# Patient Record
Sex: Male | Born: 1950 | Race: White | Hispanic: No | Marital: Married | Smoking: Never smoker
Health system: Southern US, Community
[De-identification: ages and names within clinical notes are randomized; demographics above are authoritative.]

## PROBLEM LIST (undated history)

## (undated) DIAGNOSIS — K5790 Diverticulosis of intestine, part unspecified, without perforation or abscess without bleeding: Secondary | ICD-10-CM

## (undated) DIAGNOSIS — M1990 Unspecified osteoarthritis, unspecified site: Secondary | ICD-10-CM

---

## 2018-02-28 ENCOUNTER — Emergency Department (HOSPITAL_COMMUNITY)
Admission: EM | Admit: 2018-02-28 | Discharge: 2018-02-28 | Disposition: A | Discharge status: Home / Self Care | Payer: Medicare Other | Attending: Emergency Medicine | Admitting: Emergency Medicine

## 2018-02-28 ENCOUNTER — Emergency Department (HOSPITAL_COMMUNITY): Payer: Medicare Other

## 2018-02-28 ENCOUNTER — Encounter (HOSPITAL_COMMUNITY): Payer: Self-pay | Admitting: Emergency Medicine

## 2018-02-28 DIAGNOSIS — M25552 Pain in left hip: Secondary | ICD-10-CM | POA: Diagnosis not present

## 2018-02-28 DIAGNOSIS — M5416 Radiculopathy, lumbar region: Secondary | ICD-10-CM | POA: Insufficient documentation

## 2018-02-28 LAB — URINALYSIS, ROUTINE W REFLEX MICROSCOPIC
BILIRUBIN URINE: NEGATIVE
Glucose, UA: NEGATIVE mg/dL
Hgb urine dipstick: NEGATIVE
KETONES UR: 20 mg/dL — AB
LEUKOCYTES UA: NEGATIVE
Nitrite: NEGATIVE
PROTEIN: NEGATIVE mg/dL
Specific Gravity, Urine: 1.011 (ref 1.005–1.030)
pH: 8 (ref 5.0–8.0)

## 2018-02-28 LAB — CBC WITH DIFFERENTIAL/PLATELET
BASOS PCT: 0 %
Basophils Absolute: 0 10*3/uL (ref 0.0–0.1)
EOS ABS: 0.1 10*3/uL (ref 0.0–0.7)
EOS PCT: 1 %
HEMATOCRIT: 42.8 % (ref 39.0–52.0)
Hemoglobin: 14.1 g/dL (ref 13.0–17.0)
Lymphocytes Relative: 18 %
Lymphs Abs: 1.5 10*3/uL (ref 0.7–4.0)
MCH: 27.1 pg (ref 26.0–34.0)
MCHC: 32.9 g/dL (ref 30.0–36.0)
MCV: 82.1 fL (ref 78.0–100.0)
MONO ABS: 0.8 10*3/uL (ref 0.1–1.0)
Monocytes Relative: 9 %
Neutro Abs: 6.1 10*3/uL (ref 1.7–7.7)
Neutrophils Relative %: 72 %
PLATELETS: 281 10*3/uL (ref 150–400)
RBC: 5.21 MIL/uL (ref 4.22–5.81)
RDW: 14.5 % (ref 11.5–15.5)
WBC: 8.5 10*3/uL (ref 4.0–10.5)

## 2018-02-28 LAB — COMPREHENSIVE METABOLIC PANEL
ALBUMIN: 4 g/dL (ref 3.5–5.0)
ALT: 16 U/L (ref 0–44)
ANION GAP: 8 (ref 5–15)
AST: 20 U/L (ref 15–41)
Alkaline Phosphatase: 55 U/L (ref 38–126)
BILIRUBIN TOTAL: 1.1 mg/dL (ref 0.3–1.2)
BUN: 11 mg/dL (ref 8–23)
CHLORIDE: 107 mmol/L (ref 98–111)
CO2: 27 mmol/L (ref 22–32)
Calcium: 9.3 mg/dL (ref 8.9–10.3)
Creatinine, Ser: 1.01 mg/dL (ref 0.61–1.24)
GFR calc Af Amer: >60 mL/min (ref >60)
GFR calc non Af Amer: >60 mL/min (ref >60)
GLUCOSE: 102 mg/dL — AB (ref 70–99)
POTASSIUM: 4 mmol/L (ref 3.5–5.1)
SODIUM: 142 mmol/L (ref 135–145)
TOTAL PROTEIN: 6.7 g/dL (ref 6.5–8.1)

## 2018-02-28 MED ORDER — HYDROCODONE-ACETAMINOPHEN 5-325 MG PO TABS
2.0000 | ORAL_TABLET | Freq: Once | ORAL | Status: AC
  Administered 2018-02-28: 2 via ORAL
  Filled 2018-02-28: qty 2

## 2018-02-28 MED ORDER — HYDROCODONE-ACETAMINOPHEN 5-325 MG PO TABS: 1.0000 | ORAL_TABLET | Freq: Four times a day (QID) | ORAL | 0 refills | Status: DC | PRN

## 2018-02-28 MED ORDER — NAPROXEN 500 MG PO TABS: 500.0000 mg | ORAL_TABLET | Freq: Two times a day (BID) | ORAL | 0 refills | Status: DC

## 2018-02-28 NOTE — ED Provider Notes (Signed)
[facility]-EMERGENCY DEPT Provider Note   CSN: [id number] Arrival date & time: 02/28/18  1558     History   Chief Complaint Chief Complaint  Patient presents with  . Hip Pain    HPI Glenn Duncan is a 67 y.o. male.  HPI  67 year old male, he denies any significant prior medical history, he does not smoke any cigarettes, he presents to the hospital today, with a complaint of left-sided hip pain, this seems to radiate down into his thigh and sometimes down into his calf.  He has never had this until his recent move where he has been doing lots of heavy lifting over the last couple of months, it is gradually worsening, worse with standing, totally goes away when he lays down.  It is a significant and severe discomfort which did not get any better with muscle relaxants or anti-inflammatories or a "shot in my butt" which she got at the [facility] by a physician that evaluated him this week.  He went back recently and she recommended that he come here to the hospital for evaluation with a CT scan to make sure he did not have any other acute findings.  The patient denies any numbness or weakness of the leg, the pain seems to radiate down at times.  He denies any history of cancer weight loss fevers or IV drug use.  At this time the pain is 0 out of 10 when he is sitting down.  History reviewed. No pertinent past medical history.  There are no active problems to display for this patient.   History reviewed. No pertinent surgical history.      Home Medications    Prior to Admission medications   Medication Sig Start Date End Date Taking? Authorizing Provider  diclofenac (VOLTAREN) 50 MG EC tablet Take 50 mg by mouth every 8 (eight) hours as needed for mild pain or moderate pain.   Yes [provider]  Menthol (BIOFREEZE) 10 % AERO Apply 1 applicator topically daily as needed (pain).   Yes [provider]  naproxen sodium (ALEVE)  220 MG tablet Take 440 mg by mouth 2 (two) times daily as needed (pain).   Yes [provider]  HYDROcodone-acetaminophen (NORCO/VICODIN) 5-325 MG tablet Take 1 tablet by mouth every 6 (six) hours as needed for severe pain. 02/28/18   Eber Hong, MD  naproxen (NAPROSYN) 500 MG tablet Take 1 tablet (500 mg total) by mouth 2 (two) times daily with a meal. 02/28/18   Eber Hong, MD    Family History No family history on file.  Social History Social History   Tobacco Use  . Smoking status: Never Smoker  . Smokeless tobacco: Never Used  Substance Use Topics  . Alcohol use: Not Currently  . Drug use: Not on file     Allergies   Patient has no known allergies.   Review of Systems Review of Systems  All other systems reviewed and are negative.    Physical Exam Updated Vital Signs BP (!) 164/91 (BP Location: Left Arm)   Pulse 73   Temp 98.4 F (36.9 C) (Oral)   Resp 18   Ht 5\' 8"  (1.727 m)   Wt 78.1 kg (172 lb 2 oz)   SpO2 99%   BMI 26.17 kg/m   Physical Exam  Constitutional: He appears well-developed and well-nourished. No distress.  HENT:  Head: Normocephalic and atraumatic.  Mouth/Throat: Oropharynx is clear and moist. No oropharyngeal exudate.  Eyes: Pupils  are equal, round, and reactive to light. Conjunctivae and EOM are normal. Right eye exhibits no discharge. Left eye exhibits no discharge. No scleral icterus.  Neck: Normal range of motion. Neck supple. No JVD present. No thyromegaly present.  Cardiovascular: Normal rate, regular rhythm, normal heart sounds and intact distal pulses. Exam reveals no gallop and no friction rub.  No murmur heard. Pulmonary/Chest: Effort normal and breath sounds normal. No respiratory distress. He has no wheezes. He has no rales.  Abdominal: Soft. Bowel sounds are normal. He exhibits no distension and no mass. There is no tenderness.  Musculoskeletal: Normal range of motion. He exhibits no edema or tenderness.  There is  no tenderness to palpation over the thoracic or lumbar spine  Lymphadenopathy:    He has no cervical adenopathy.  Neurological: He is alert. Coordination normal.  The patient is able to straight leg raise bilaterally.  He is able to feel normal sensation diffusely through the bilateral lower extremities, he has normal strength at the hips to flexion and extension, knees to flexion and extension, ankles to dorsiflexion and plantarflexion and is able to raise both great toes against resistance.  He has normal reflexes at the bilateral patellar tendons.  Skin: Skin is warm and dry. No rash noted. No erythema.  Psychiatric: He has a normal mood and affect. His behavior is normal.  Nursing note and vitals reviewed.    ED Treatments / Results  Labs (all labs ordered are listed, but only abnormal results are displayed) Labs Reviewed  COMPREHENSIVE METABOLIC PANEL - Abnormal; Notable for the following components:      Result Value   Glucose, Bld 102 (*)    All other components within normal limits  URINALYSIS, ROUTINE W REFLEX MICROSCOPIC - Abnormal; Notable for the following components:   Color, Urine STRAW (*)    Ketones, ur 20 (*)    All other components within normal limits  CBC WITH DIFFERENTIAL/PLATELET   EKG None  Radiology Ct Lumbar Spine Wo Contrast  Result Date: 02/28/2018 CLINICAL DATA:  Back pain. EXAM: CT LUMBAR SPINE WITHOUT CONTRAST TECHNIQUE: Multidetector CT imaging of the lumbar spine was performed without intravenous contrast administration. Multiplanar CT image reconstructions were also generated. COMPARISON:  None. FINDINGS: Segmentation: 5 lumbar type vertebrae. Alignment: Mild lateral subluxation of L4 relative to the above and below levels. Vertebrae: No acute fracture or focal pathologic process. There is degenerative endplate remodeling at L4-L5. Paraspinal and other soft tissues: Mild calcific aortic atherosclerosis. Otherwise normal. Disc levels: T12-L1: Normal.  L1-L2: Normal. L2-L3: Normal. L3-L4: Disc space narrowing with disc vacuum phenomenon. Mild facet hypertrophy. Mild narrowing of the spinal canal. Mild right foraminal stenosis. L4-L5: Severe disc space narrowing with moderate facet hypertrophy. No spinal canal stenosis. Moderate right and severe left foraminal stenosis. L5-S1: Severe right and moderate left facet hypertrophy. No spinal canal stenosis. Moderate right and severe left foraminal stenosis. IMPRESSION: 1. L5-S1 moderate right, severe left neural foraminal stenosis, primarily due to moderate-to-severe facet arthrosis. 2. L4-L5 moderate right, severe left foraminal stenosis due to combination of disc degeneration and moderate facet arthrosis. 3.  Aortic Atherosclerosis (ICD10-I70.0). Electronically Signed   By: Deatra RobinsonKevin  Herman M.D.   On: 02/28/2018 20:05    Procedures Procedures (including critical care time)  Medications Ordered in ED Medications  HYDROcodone-acetaminophen (NORCO/VICODIN) 5-325 MG per tablet 2 tablet (2 tablets Oral Given 02/28/18 2115)     Initial Impression / Assessment and Plan / ED Course  I have reviewed the triage vital  signs and the nursing notes.  Pertinent labs & imaging results that were available during my care of the patient were reviewed by me and considered in my medical decision making (see chart for details).     The patient has had progressive pain going into his left thigh and leg, this does not appear to be consistent with bursitis and he has no tenderness over the bursa, has no tenderness with range of motion of the hip, no history of other medical history including cancer, will perform a CT scan of the lumbar spine as there is no MRI available at this time.  Will evaluate for bony cause of his symptoms, he may need outpatient MRI.  This does not seem to be abdominal as he has no abdominal tenderness.  CT shows some foraminal stenosis - no acute neurological findings Pt agreeable to f/u - needs pain  medicine / nsaids / pred didn't work before, no indication to redose at [employer] time. Pt given copy of CT report.  Final Clinical Impressions(s) / ED Diagnoses   Final diagnoses:  Lumbar radiculopathy    ED Discharge Orders        Ordered    HYDROcodone-acetaminophen (NORCO/VICODIN) 5-325 MG tablet  Every 6 hours PRN     02/28/18 2128    naproxen (NAPROSYN) 500 MG tablet  2 times daily with meals     02/28/18 2128       Eber Hong, MD 02/28/18 2131

## 2018-02-28 NOTE — Discharge Instructions (Addendum)
Please take Naprosyn twice daily Hydrocodone only for severe pain  Call Dr. Jordan LikesPOol for next available visit.  ER for severe or worsening pain / numbness or weakness

## 2018-02-28 NOTE — ED Triage Notes (Signed)
Pt reports hx of bursitis in left hip and taken medications and no relief. Reports he drove to [place] back in April and was seen at facility then. Pt reports pain is worse with standing and sitting but will subside with laying flat.

## 2018-03-23 ENCOUNTER — Emergency Department (HOSPITAL_COMMUNITY): Payer: Medicare Other

## 2018-03-23 ENCOUNTER — Inpatient Hospital Stay (HOSPITAL_COMMUNITY): Payer: Medicare Other | Admitting: Anesthesiology

## 2018-03-23 ENCOUNTER — Encounter (HOSPITAL_COMMUNITY): Admission: EM | Disposition: A | Discharge status: Home Health | Payer: Self-pay | Source: Home / Self Care

## 2018-03-23 ENCOUNTER — Inpatient Hospital Stay (HOSPITAL_COMMUNITY): Payer: Medicare Other

## 2018-03-23 ENCOUNTER — Inpatient Hospital Stay (HOSPITAL_COMMUNITY)
Admission: EM | Admit: 2018-03-23 | Discharge: 2018-03-27 | DRG: 853 | Disposition: A | Discharge status: Home Health | Payer: Medicare Other | Attending: Surgery | Admitting: Surgery

## 2018-03-23 ENCOUNTER — Other Ambulatory Visit: Payer: Self-pay

## 2018-03-23 ENCOUNTER — Encounter (HOSPITAL_COMMUNITY): Payer: Self-pay | Admitting: Emergency Medicine

## 2018-03-23 DIAGNOSIS — Z79899 Other long term (current) drug therapy: Secondary | ICD-10-CM

## 2018-03-23 DIAGNOSIS — K5720 Diverticulitis of large intestine with perforation and abscess without bleeding: Secondary | ICD-10-CM | POA: Diagnosis present

## 2018-03-23 DIAGNOSIS — K659 Peritonitis, unspecified: Secondary | ICD-10-CM | POA: Diagnosis present

## 2018-03-23 DIAGNOSIS — R198 Other specified symptoms and signs involving the digestive system and abdomen: Secondary | ICD-10-CM | POA: Diagnosis present

## 2018-03-23 DIAGNOSIS — G8929 Other chronic pain: Secondary | ICD-10-CM | POA: Diagnosis present

## 2018-03-23 DIAGNOSIS — A419 Sepsis, unspecified organism: Principal | ICD-10-CM | POA: Diagnosis present

## 2018-03-23 DIAGNOSIS — M549 Dorsalgia, unspecified: Secondary | ICD-10-CM | POA: Diagnosis present

## 2018-03-23 DIAGNOSIS — Z978 Presence of other specified devices: Secondary | ICD-10-CM

## 2018-03-23 DIAGNOSIS — K631 Perforation of intestine (nontraumatic): Secondary | ICD-10-CM

## 2018-03-23 DIAGNOSIS — K56609 Unspecified intestinal obstruction, unspecified as to partial versus complete obstruction: Secondary | ICD-10-CM

## 2018-03-23 HISTORY — PX: LAPAROTOMY: SHX154

## 2018-03-23 LAB — URINALYSIS, ROUTINE W REFLEX MICROSCOPIC
BILIRUBIN URINE: NEGATIVE
Glucose, UA: NEGATIVE mg/dL
HGB URINE DIPSTICK: NEGATIVE
KETONES UR: 5 mg/dL — AB
LEUKOCYTES UA: NEGATIVE
Nitrite: NEGATIVE
Protein, ur: 100 mg/dL — AB
pH: 5 (ref 5.0–8.0)

## 2018-03-23 LAB — I-STAT CHEM 8, ED
BUN: 28 mg/dL — ABNORMAL HIGH (ref 8–23)
CALCIUM ION: 1.11 mmol/L — AB (ref 1.15–1.40)
Chloride: 98 mmol/L (ref 98–111)
Creatinine, Ser: 1.2 mg/dL (ref 0.61–1.24)
GLUCOSE: 160 mg/dL — AB (ref 70–99)
HCT: 48 % (ref 39.0–52.0)
HEMOGLOBIN: 16.3 g/dL (ref 13.0–17.0)
POTASSIUM: 3.9 mmol/L (ref 3.5–5.1)
Sodium: 134 mmol/L — ABNORMAL LOW (ref 135–145)
TCO2: 25 mmol/L (ref 22–32)

## 2018-03-23 LAB — D-DIMER, QUANTITATIVE (NOT AT ARMC): D DIMER QUANT: 3.99 ug{FEU}/mL — AB (ref 0.00–0.50)

## 2018-03-23 LAB — CBC
HEMATOCRIT: 48 % (ref 39.0–52.0)
HEMOGLOBIN: 16.2 g/dL (ref 13.0–17.0)
MCH: 27.5 pg (ref 26.0–34.0)
MCHC: 33.8 g/dL (ref 30.0–36.0)
MCV: 81.5 fL (ref 78.0–100.0)
Platelets: 253 10*3/uL (ref 150–400)
RBC: 5.89 MIL/uL — AB (ref 4.22–5.81)
RDW: 14.5 % (ref 11.5–15.5)
WBC: 17.2 10*3/uL — AB (ref 4.0–10.5)

## 2018-03-23 LAB — COMPREHENSIVE METABOLIC PANEL
ALT: 48 U/L — ABNORMAL HIGH (ref 0–44)
AST: 42 U/L — ABNORMAL HIGH (ref 15–41)
Albumin: 3.9 g/dL (ref 3.5–5.0)
Alkaline Phosphatase: 93 U/L (ref 38–126)
Anion gap: 17 — ABNORMAL HIGH (ref 5–15)
BUN: 26 mg/dL — ABNORMAL HIGH (ref 8–23)
CHLORIDE: 98 mmol/L (ref 98–111)
CO2: 24 mmol/L (ref 22–32)
Calcium: 10 mg/dL (ref 8.9–10.3)
Creatinine, Ser: 1.44 mg/dL — ABNORMAL HIGH (ref 0.61–1.24)
GFR, EST AFRICAN AMERICAN: 57 mL/min — AB (ref >60)
GFR, EST NON AFRICAN AMERICAN: 49 mL/min — AB (ref >60)
Glucose, Bld: 163 mg/dL — ABNORMAL HIGH (ref 70–99)
POTASSIUM: 4.4 mmol/L (ref 3.5–5.1)
Sodium: 139 mmol/L (ref 135–145)
Total Bilirubin: 1.2 mg/dL (ref 0.3–1.2)
Total Protein: 8 g/dL (ref 6.5–8.1)

## 2018-03-23 LAB — SURGICAL PCR SCREEN
MRSA, PCR: NEGATIVE
Staphylococcus aureus: NEGATIVE

## 2018-03-23 LAB — MAGNESIUM: MAGNESIUM: 2.2 mg/dL (ref 1.7–2.4)

## 2018-03-23 LAB — LIPASE, BLOOD: LIPASE: 22 U/L (ref 11–51)

## 2018-03-23 LAB — I-STAT CG4 LACTIC ACID, ED: Lactic Acid, Venous: 1.88 mmol/L (ref 0.5–1.9)

## 2018-03-23 SURGERY — LAPAROTOMY, EXPLORATORY
Anesthesia: General

## 2018-03-23 MED ORDER — FENTANYL CITRATE (PF) 100 MCG/2ML IJ SOLN: 25.0000 ug | INTRAMUSCULAR | Status: DC | PRN

## 2018-03-23 MED ORDER — MIDAZOLAM HCL 2 MG/2ML IJ SOLN
INTRAMUSCULAR | Status: AC
  Filled 2018-03-23: qty 2

## 2018-03-23 MED ORDER — CHLORHEXIDINE GLUCONATE CLOTH 2 % EX PADS: 6.0000 | MEDICATED_PAD | Freq: Once | CUTANEOUS | Status: DC

## 2018-03-23 MED ORDER — ONDANSETRON HCL 4 MG/2ML IJ SOLN
4.0000 mg | Freq: Once | INTRAMUSCULAR | Status: AC
  Administered 2018-03-23: 4 mg via INTRAVENOUS
  Filled 2018-03-23: qty 2

## 2018-03-23 MED ORDER — 0.9 % SODIUM CHLORIDE (POUR BTL) OPTIME
TOPICAL | Status: DC | PRN
  Administered 2018-03-23: 3000 mL

## 2018-03-23 MED ORDER — VANCOMYCIN HCL IN DEXTROSE 1-5 GM/200ML-% IV SOLN
1000.0000 mg | Freq: Once | INTRAVENOUS | Status: AC
  Administered 2018-03-23: 1000 mg via INTRAVENOUS
  Filled 2018-03-23: qty 200

## 2018-03-23 MED ORDER — ONDANSETRON HCL 4 MG/2ML IJ SOLN: 4.0000 mg | Freq: Four times a day (QID) | INTRAMUSCULAR | Status: DC | PRN

## 2018-03-23 MED ORDER — LIDOCAINE 2% (20 MG/ML) 5 ML SYRINGE
INTRAMUSCULAR | Status: DC | PRN
  Administered 2018-03-23: 50 mg via INTRAVENOUS

## 2018-03-23 MED ORDER — FENTANYL CITRATE (PF) 250 MCG/5ML IJ SOLN
INTRAMUSCULAR | Status: AC
  Filled 2018-03-23: qty 5

## 2018-03-23 MED ORDER — DIPHENHYDRAMINE HCL 12.5 MG/5ML PO ELIX: 12.5000 mg | ORAL_SOLUTION | Freq: Four times a day (QID) | ORAL | Status: DC | PRN

## 2018-03-23 MED ORDER — SODIUM CHLORIDE 0.9 % IV SOLN
INTRAVENOUS | Status: DC
  Administered 2018-03-23 – 2018-03-27 (×6): via INTRAVENOUS

## 2018-03-23 MED ORDER — NALOXONE HCL 0.4 MG/ML IJ SOLN
0.4000 mg | INTRAMUSCULAR | Status: DC | PRN
Start: 2018-03-23 — End: 2018-03-26

## 2018-03-23 MED ORDER — DEXAMETHASONE SODIUM PHOSPHATE 10 MG/ML IJ SOLN
INTRAMUSCULAR | Status: AC
  Filled 2018-03-23: qty 1

## 2018-03-23 MED ORDER — MORPHINE SULFATE (PF) 2 MG/ML IV SOLN: 1.0000 mg | INTRAVENOUS | Status: DC | PRN

## 2018-03-23 MED ORDER — SUGAMMADEX SODIUM 200 MG/2ML IV SOLN
INTRAVENOUS | Status: AC
  Filled 2018-03-23: qty 2

## 2018-03-23 MED ORDER — ONDANSETRON HCL 4 MG/2ML IJ SOLN: 4.0000 mg | Freq: Once | INTRAMUSCULAR | Status: DC | PRN

## 2018-03-23 MED ORDER — ACETAMINOPHEN 10 MG/ML IV SOLN
INTRAVENOUS | Status: AC
  Filled 2018-03-23: qty 100

## 2018-03-23 MED ORDER — SODIUM CHLORIDE 0.9% FLUSH: 9.0000 mL | INTRAVENOUS | Status: DC | PRN

## 2018-03-23 MED ORDER — LACTATED RINGERS IV SOLN
INTRAVENOUS | Status: DC | PRN
  Administered 2018-03-23 (×2): via INTRAVENOUS

## 2018-03-23 MED ORDER — FAMOTIDINE IN NACL 20-0.9 MG/50ML-% IV SOLN
20.0000 mg | Freq: Two times a day (BID) | INTRAVENOUS | Status: DC
  Administered 2018-03-23 – 2018-03-26 (×8): 20 mg via INTRAVENOUS
  Filled 2018-03-23 (×9): qty 50

## 2018-03-23 MED ORDER — ONDANSETRON HCL 4 MG/2ML IJ SOLN
INTRAMUSCULAR | Status: AC
  Filled 2018-03-23: qty 2

## 2018-03-23 MED ORDER — HYDROMORPHONE HCL 2 MG/ML IJ SOLN
INTRAMUSCULAR | Status: AC
  Filled 2018-03-23: qty 1

## 2018-03-23 MED ORDER — METHOCARBAMOL 500 MG IVPB - SIMPLE MED
500.0000 mg | Freq: Three times a day (TID) | INTRAVENOUS | Status: DC
  Administered 2018-03-23 – 2018-03-26 (×8): 500 mg via INTRAVENOUS
  Filled 2018-03-23 (×3): qty 500
  Filled 2018-03-23 (×2): qty 50
  Filled 2018-03-23 (×3): qty 500
  Filled 2018-03-23: qty 50
  Filled 2018-03-23: qty 500

## 2018-03-23 MED ORDER — SUCCINYLCHOLINE CHLORIDE 200 MG/10ML IV SOSY
PREFILLED_SYRINGE | INTRAVENOUS | Status: AC
  Filled 2018-03-23: qty 10

## 2018-03-23 MED ORDER — ROCURONIUM BROMIDE 10 MG/ML (PF) SYRINGE
PREFILLED_SYRINGE | INTRAVENOUS | Status: AC
  Filled 2018-03-23: qty 10

## 2018-03-23 MED ORDER — OXYCODONE HCL 5 MG/5ML PO SOLN
5.0000 mg | Freq: Once | ORAL | Status: DC | PRN
  Filled 2018-03-23: qty 5

## 2018-03-23 MED ORDER — FENTANYL CITRATE (PF) 100 MCG/2ML IJ SOLN
50.0000 ug | Freq: Once | INTRAMUSCULAR | Status: AC
  Administered 2018-03-23: 50 ug via INTRAVENOUS
  Filled 2018-03-23: qty 2

## 2018-03-23 MED ORDER — FENTANYL CITRATE (PF) 100 MCG/2ML IJ SOLN
INTRAMUSCULAR | Status: DC | PRN
  Administered 2018-03-23: 100 ug via INTRAVENOUS
  Administered 2018-03-23 (×3): 50 ug via INTRAVENOUS

## 2018-03-23 MED ORDER — PHENYLEPHRINE 40 MCG/ML (10ML) SYRINGE FOR IV PUSH (FOR BLOOD PRESSURE SUPPORT)
PREFILLED_SYRINGE | INTRAVENOUS | Status: DC | PRN
  Administered 2018-03-23: 80 ug via INTRAVENOUS
  Administered 2018-03-23: 40 ug via INTRAVENOUS

## 2018-03-23 MED ORDER — OXYCODONE HCL 5 MG PO TABS: 5.0000 mg | ORAL_TABLET | Freq: Once | ORAL | Status: DC | PRN

## 2018-03-23 MED ORDER — MORPHINE SULFATE 2 MG/ML IV SOLN
INTRAVENOUS | Status: DC
  Administered 2018-03-23: 1.5 mg via INTRAVENOUS
  Administered 2018-03-23: 17:00:00 via INTRAVENOUS
  Administered 2018-03-23: 6 mg via INTRAVENOUS
  Administered 2018-03-24: 1.5 mg via INTRAVENOUS
  Administered 2018-03-24: 0 mg via INTRAVENOUS
  Administered 2018-03-24: 2 mg via INTRAVENOUS
  Administered 2018-03-24 – 2018-03-25 (×4): 0 mg via INTRAVENOUS
  Administered 2018-03-25: 2 mg via INTRAVENOUS
  Administered 2018-03-25 – 2018-03-26 (×3): 0 mg via INTRAVENOUS
  Filled 2018-03-23: qty 25

## 2018-03-23 MED ORDER — LACTATED RINGERS IV SOLN
INTRAVENOUS | Status: DC
  Administered 2018-03-23 (×2): via INTRAVENOUS

## 2018-03-23 MED ORDER — ROCURONIUM BROMIDE 10 MG/ML (PF) SYRINGE
PREFILLED_SYRINGE | INTRAVENOUS | Status: DC | PRN
  Administered 2018-03-23: 50 mg via INTRAVENOUS

## 2018-03-23 MED ORDER — LIDOCAINE 2% (20 MG/ML) 5 ML SYRINGE
INTRAMUSCULAR | Status: AC
  Filled 2018-03-23: qty 5

## 2018-03-23 MED ORDER — SODIUM CHLORIDE 0.9 % IV SOLN
2.0000 g | INTRAVENOUS | Status: AC
  Administered 2018-03-23: 2 g via INTRAVENOUS
  Filled 2018-03-23: qty 2

## 2018-03-23 MED ORDER — IOPAMIDOL (ISOVUE-370) INJECTION 76%
INTRAVENOUS | Status: AC
  Filled 2018-03-23: qty 100

## 2018-03-23 MED ORDER — ALBUMIN HUMAN 5 % IV SOLN
INTRAVENOUS | Status: DC | PRN
  Administered 2018-03-23: 15:00:00 via INTRAVENOUS

## 2018-03-23 MED ORDER — DIPHENHYDRAMINE HCL 50 MG/ML IJ SOLN: 12.5000 mg | Freq: Four times a day (QID) | INTRAMUSCULAR | Status: DC | PRN

## 2018-03-23 MED ORDER — IOPAMIDOL (ISOVUE-370) INJECTION 76%
100.0000 mL | Freq: Once | INTRAVENOUS | Status: AC | PRN
  Administered 2018-03-23: 100 mL via INTRAVENOUS

## 2018-03-23 MED ORDER — CHLORHEXIDINE GLUCONATE CLOTH 2 % EX PADS
6.0000 | MEDICATED_PAD | Freq: Once | CUTANEOUS | Status: AC
  Administered 2018-03-23: 6 via TOPICAL

## 2018-03-23 MED ORDER — ONDANSETRON HCL 4 MG/2ML IJ SOLN
INTRAMUSCULAR | Status: DC | PRN
  Administered 2018-03-23: 4 mg via INTRAVENOUS

## 2018-03-23 MED ORDER — ONDANSETRON 4 MG PO TBDP: 4.0000 mg | ORAL_TABLET | Freq: Four times a day (QID) | ORAL | Status: DC | PRN

## 2018-03-23 MED ORDER — PROPOFOL 10 MG/ML IV BOLUS
INTRAVENOUS | Status: AC
  Filled 2018-03-23: qty 20

## 2018-03-23 MED ORDER — PHENYLEPHRINE 40 MCG/ML (10ML) SYRINGE FOR IV PUSH (FOR BLOOD PRESSURE SUPPORT)
PREFILLED_SYRINGE | INTRAVENOUS | Status: AC
  Filled 2018-03-23: qty 10

## 2018-03-23 MED ORDER — DEXAMETHASONE SODIUM PHOSPHATE 10 MG/ML IJ SOLN
INTRAMUSCULAR | Status: DC | PRN
  Administered 2018-03-23: 10 mg via INTRAVENOUS

## 2018-03-23 MED ORDER — ACETAMINOPHEN 10 MG/ML IV SOLN
1000.0000 mg | Freq: Four times a day (QID) | INTRAVENOUS | Status: AC
  Administered 2018-03-23 – 2018-03-24 (×4): 1000 mg via INTRAVENOUS
  Filled 2018-03-23 (×4): qty 100

## 2018-03-23 MED ORDER — METHOCARBAMOL 500 MG IVPB - SIMPLE MED
INTRAVENOUS | Status: AC
  Administered 2018-03-23: 500 mg via INTRAVENOUS
  Filled 2018-03-23: qty 50

## 2018-03-23 MED ORDER — PROPOFOL 10 MG/ML IV BOLUS
INTRAVENOUS | Status: DC | PRN
  Administered 2018-03-23: 200 mg via INTRAVENOUS

## 2018-03-23 MED ORDER — SUGAMMADEX SODIUM 200 MG/2ML IV SOLN
INTRAVENOUS | Status: DC | PRN
  Administered 2018-03-23: 150 mg via INTRAVENOUS

## 2018-03-23 MED ORDER — LACTATED RINGERS IV SOLN: INTRAVENOUS | Status: DC

## 2018-03-23 MED ORDER — SUCCINYLCHOLINE CHLORIDE 200 MG/10ML IV SOSY
PREFILLED_SYRINGE | INTRAVENOUS | Status: DC | PRN
  Administered 2018-03-23: 120 mg via INTRAVENOUS

## 2018-03-23 MED ORDER — HYDROMORPHONE HCL 1 MG/ML IJ SOLN
INTRAMUSCULAR | Status: DC | PRN
  Administered 2018-03-23 (×4): 0.5 mg via INTRAVENOUS

## 2018-03-23 MED ORDER — MIDAZOLAM HCL 5 MG/5ML IJ SOLN
INTRAMUSCULAR | Status: DC | PRN
  Administered 2018-03-23: 2 mg via INTRAVENOUS

## 2018-03-23 MED ORDER — PIPERACILLIN-TAZOBACTAM 3.375 G IVPB 30 MIN
3.3750 g | Freq: Once | INTRAVENOUS | Status: AC
  Administered 2018-03-23: 3.375 g via INTRAVENOUS
  Filled 2018-03-23: qty 50

## 2018-03-23 MED ORDER — PIPERACILLIN-TAZOBACTAM 3.375 G IVPB
3.3750 g | Freq: Three times a day (TID) | INTRAVENOUS | Status: DC
  Administered 2018-03-23 – 2018-03-27 (×11): 3.375 g via INTRAVENOUS
  Filled 2018-03-23 (×22): qty 50

## 2018-03-23 MED ORDER — SODIUM CHLORIDE 0.9 % IV BOLUS
1000.0000 mL | Freq: Once | INTRAVENOUS | Status: AC
  Administered 2018-03-23: 1000 mL via INTRAVENOUS

## 2018-03-23 MED ORDER — LORAZEPAM 2 MG/ML IJ SOLN: 0.5000 mg | Freq: Once | INTRAMUSCULAR | Status: DC | PRN

## 2018-03-23 SURGICAL SUPPLY — 47 items
APPLICATOR COTTON TIP 6 STRL (MISCELLANEOUS) ×1 IMPLANT
APPLICATOR COTTON TIP 6IN STRL (MISCELLANEOUS) ×3
BARRIER SKIN 2 3/4 (OSTOMY) ×2 IMPLANT
BARRIER SKIN 2 3/4 INCH (OSTOMY) ×1
BLADE EXTENDED COATED 6.5IN (ELECTRODE) IMPLANT
BLADE HEX COATED 2.75 (ELECTRODE) ×3 IMPLANT
BNDG GAUZE ELAST 4 BULKY (GAUZE/BANDAGES/DRESSINGS) ×3 IMPLANT
CHLORAPREP W/TINT 26ML (MISCELLANEOUS) ×3 IMPLANT
COVER MAYO STAND STRL (DRAPES) IMPLANT
COVER SURGICAL LIGHT HANDLE (MISCELLANEOUS) ×3 IMPLANT
DRAPE LAPAROSCOPIC ABDOMINAL (DRAPES) ×3 IMPLANT
DRAPE WARM FLUID 44X44 (DRAPE) ×3 IMPLANT
DRSG PAD ABDOMINAL 8X10 ST (GAUZE/BANDAGES/DRESSINGS) ×6 IMPLANT
ELECT BLADE TIP CTD 4 INCH (ELECTRODE) ×3 IMPLANT
ELECT REM PT RETURN 15FT ADLT (MISCELLANEOUS) ×3 IMPLANT
GAUZE SPONGE 4X4 12PLY STRL (GAUZE/BANDAGES/DRESSINGS) ×3 IMPLANT
GLOVE BIOGEL PI IND STRL 7.0 (GLOVE) ×1 IMPLANT
GLOVE BIOGEL PI INDICATOR 7.0 (GLOVE) ×2
GLOVE SURG ORTHO 8.0 STRL STRW (GLOVE) ×3 IMPLANT
GOWN STRL REUS W/TWL LRG LVL3 (GOWN DISPOSABLE) ×3 IMPLANT
GOWN STRL REUS W/TWL XL LVL3 (GOWN DISPOSABLE) ×6 IMPLANT
HANDLE SUCTION POOLE (INSTRUMENTS) ×1 IMPLANT
KIT BASIN OR (CUSTOM PROCEDURE TRAY) ×3 IMPLANT
PACK GENERAL/GYN (CUSTOM PROCEDURE TRAY) ×3 IMPLANT
POUCH OSTOMY 2 3/4  H 3804 (WOUND CARE) ×2
POUCH OSTOMY 2 PC DRNBL 2.75 (WOUND CARE) ×1 IMPLANT
SEALER TISSUE X1 CVD JAW (INSTRUMENTS) ×3 IMPLANT
SPONGE LAP 18X18 RF (DISPOSABLE) ×6 IMPLANT
STAPLER CUT CVD 40MM BLUE (STAPLE) ×3 IMPLANT
STAPLER PROXIMATE 75MM BLUE (STAPLE) ×3 IMPLANT
STAPLER VISISTAT 35W (STAPLE) IMPLANT
SUCTION POOLE HANDLE (INSTRUMENTS) ×3
SUT NOV 1 T60/GS (SUTURE) ×3 IMPLANT
SUT NOVA 1 T20/GS 25DT (SUTURE) ×15 IMPLANT
SUT PROLENE 2 0 SH DA (SUTURE) ×3 IMPLANT
SUT SILK 2 0 (SUTURE)
SUT SILK 2 0 SH CR/8 (SUTURE) IMPLANT
SUT SILK 2-0 18XBRD TIE 12 (SUTURE) IMPLANT
SUT SILK 3 0 (SUTURE)
SUT SILK 3 0 SH CR/8 (SUTURE) IMPLANT
SUT SILK 3-0 18XBRD TIE 12 (SUTURE) IMPLANT
SUT VIC AB 3-0 SH 18 (SUTURE) ×6 IMPLANT
SUT VICRYL 2 0 18  UND BR (SUTURE)
SUT VICRYL 2 0 18 UND BR (SUTURE) IMPLANT
TOWEL OR 17X26 10 PK STRL BLUE (TOWEL DISPOSABLE) ×3 IMPLANT
TRAY FOLEY MTR SLVR 16FR STAT (SET/KITS/TRAYS/PACK) ×3 IMPLANT
YANKAUER SUCT BULB TIP NO VENT (SUCTIONS) IMPLANT

## 2018-03-23 NOTE — H&P (Signed)
Glenn Duncan is an 67 y.o. male.   Chief Complaint: Abdominal pain, fever, back pain   HPI: Patient seen 02/28/2018 with back pain.  He has been taking nonsteroidals and hydrocodone for pain.  CT at that time showed L5-S1 moderate right severe neuroforaminal stenosis, primarily due to moderate to severe facet arthrosis.  L4-L5 moderate right severe foraminal stenosis due to a combination of disc degeneration and moderate facet arthrosis.  He has seen neurosurgery and Dr. Trenton Gammon for this.  He is taking Aleve 220 q12h and Vicodin about 1 q6h  since that visit .  Today he presented with the above complaint for the last 48 hours, unable to eat, nausea and vomiting, lower abdominal pain.   He was evaluated with 3 view film which addendum shows pneumoperitoneum.  MRI was also obtained which shows dilated small bowel with air-fluid levels and pneumoperitoneum.  Again issues with L2-L3, L3-L4, L4-L5. CT of the abdomen and chest are in progress we are asked to see.  On admission his blood pressure and temperature are normal.  His last blood pressure at 801 was 187/100.  Labs show a creatinine of 1.44 on admission follow-up was 1.20.  Glucose elevated 160.  WBC 17.2 hemoglobin 16, hematocrit 48.  D-dimer 3.99.  Urinalysis is normal.  Lactate is 1.88. CT's chest, abdomen and pelvis with/without contrast:  No PE.  Extensive free air is identified in the abdomen and pelvis more prominently in the abdomen. This consistent with perforated bowel loop. There is stranding and inflammation surrounding thickened small bowel loops in the lower mid abdomen with marked dilatation of small bowel loops proximal to this area of inflammation. The ileal small bowel loops are normal in caliber but demonstrate abnormal diffuse thickened bowel wall. There is diverticulosis of the colon without definite evidence of diverticulitis. The appendix is normal.  History reviewed. No pertinent past medical history. No prior surgeries No  prior medical issues before this History reviewed. No pertinent surgical history.  No family history on file. Social History:  reports that he has never smoked. He has never used smokeless tobacco. He reports that he drank alcohol. His drug history is not on file. He just moved to [place] with wife in June to be near son and grandchildren Retired [employer] farm manager ETOH:  None Drugs:  None Tobacco:  None  Allergies: No Known Allergies  Prior to Admission medications   Medication Sig Start Date End Date Taking? Authorizing Provider  HYDROcodone-acetaminophen (NORCO/VICODIN) 5-325 MG tablet Take 1 tablet by mouth every 6 (six) hours as needed for severe pain. 02/28/18  Yes Noemi Chapel, MD  Menthol (BIOFREEZE) 10 % AERO Apply 1 applicator topically daily as needed (pain).   Yes [provider]  naproxen sodium (ALEVE) 220 MG tablet Take 220 mg by mouth 2 (two) times daily as needed (pain).    Yes [provider]  naproxen (NAPROSYN) 500 MG tablet Take 1 tablet (500 mg total) by mouth 2 (two) times daily with a meal. Patient not taking: Reported on 03/23/2018 02/28/18   Noemi Chapel, MD     Results for orders placed or performed during the hospital encounter of 03/23/18 (from the past 48 hour(s))  Lipase, blood     Status: None   Collection Time: 03/23/18  6:18 AM  Result Value Ref Range   Lipase 22 11 - 51 U/L    Comment: Performed at [facility], [place] [address], [place], [place] [address]  Comprehensive metabolic panel  Status: Abnormal   Collection Time: 03/23/18  6:18 AM  Result Value Ref Range   Sodium 139 135 - 145 mmol/L   Potassium 4.4 3.5 - 5.1 mmol/L   Chloride 98 98 - 111 mmol/L   CO2 24 22 - 32 mmol/L   Glucose, Bld 163 (H) 70 - 99 mg/dL   BUN 26 (H) 8 - 23 mg/dL   Creatinine, Ser 1.44 (H) 0.61 - 1.24 mg/dL   Calcium 10.0 8.9 - 10.3 mg/dL   Total Protein 8.0 6.5 - 8.1 g/dL   Albumin 3.9 3.5 - 5.0 g/dL   AST 42 (H) 15 - 41  U/L   ALT 48 (H) 0 - 44 U/L   Alkaline Phosphatase 93 38 - 126 U/L   Total Bilirubin 1.2 0.3 - 1.2 mg/dL   GFR calc non Af Amer 49 (L) >60 mL/min   GFR calc Af Amer 57 (L) >60 mL/min    Comment: (NOTE) The eGFR has been calculated using the CKD EPI equation. This calculation has not been validated in all clinical situations. eGFR's persistently <60 mL/min signify possible Chronic Kidney Disease.    Anion gap 17 (H) 5 - 15    Comment: Performed at [facility], [place] [address], [place], [place] [address]  CBC     Status: Abnormal   Collection Time: 03/23/18  6:18 AM  Result Value Ref Range   WBC 17.2 (H) 4.0 - 10.5 K/uL   RBC 5.89 (H) 4.22 - 5.81 MIL/uL   Hemoglobin 16.2 13.0 - 17.0 g/dL   HCT 48.0 39.0 - 52.0 %   MCV 81.5 78.0 - 100.0 fL   MCH 27.5 26.0 - 34.0 pg   MCHC 33.8 30.0 - 36.0 g/dL   RDW 14.5 11.5 - 15.5 %   Platelets 253 150 - 400 K/uL    Comment: Performed at [facility], [place] [address] [place], [place] [address]  D-dimer, quantitative (not at [facility])     Status: Abnormal   Collection Time: 03/23/18  7:07 AM  Result Value Ref Range   D-Dimer, Quant 3.99 (H) 0.00 - 0.50 ug/mL-FEU    Comment: (NOTE) At the manufacturer cut-off of 0.50 ug/mL FEU, this assay has been documented to exclude PE with a sensitivity and negative predictive value of 97 to 99%.  At this time, this assay has not been approved by the FDA to exclude DVT/VTE. Results should be correlated with clinical presentation. Performed at [facility], [place] [address], [place], [place] [address]   I-stat chem 8, ed     Status: Abnormal   Collection Time: 03/23/18  7:13 AM  Result Value Ref Range   Sodium 134 (L) 135 - 145 mmol/L   Potassium 3.9 3.5 - 5.1 mmol/L   Chloride 98 98 - 111 mmol/L   BUN 28 (H) 8 - 23 mg/dL   Creatinine, Ser 1.20 0.61 - 1.24 mg/dL   Glucose, Bld 160 (H) 70 - 99 mg/dL   Calcium, Ion 1.11 (L) 1.15 - 1.40 mmol/L   TCO2 25  22 - 32 mmol/L   Hemoglobin 16.3 13.0 - 17.0 g/dL   HCT 48.0 39.0 - 52.0 %  I-Stat CG4 Lactic Acid, ED     Status: None   Collection Time: 03/23/18  7:14 AM  Result Value Ref Range   Lactic Acid, Venous 1.88 0.5 - 1.9 mmol/L   Ct Angio Chest Pe W And/or Wo Contrast  Result Date: 03/23/2018 CLINICAL DATA:  Abdomen pain for 3 days. Assess for diverticulitis. Positive D-dimer test assess for pulmonary embolus. EXAM: CT ANGIOGRAPHY CHEST CT ABDOMEN AND PELVIS WITH CONTRAST TECHNIQUE: Multidetector CT imaging of the chest was performed using the standard protocol during bolus administration of intravenous contrast. Multiplanar CT image reconstructions and MIPs were obtained to evaluate the vascular anatomy. Multidetector CT imaging of the abdomen and pelvis was performed using the standard protocol during bolus administration of intravenous contrast. CONTRAST:  129m ISOVUE-370 IOPAMIDOL (ISOVUE-370) INJECTION 76% COMPARISON:  None. FINDINGS: CTA CHEST FINDINGS Cardiovascular: Satisfactory opacification of the pulmonary arteries to the segmental level. No evidence of pulmonary embolism. Normal heart size. No pericardial effusion. Mediastinum/Nodes: No enlarged mediastinal, hilar, or axillary lymph nodes. Thyroid gland, trachea, and esophagus demonstrate no significant findings. Lungs/Pleura: There is atelectasis of the lung bases. No focal pneumonia or pleural effusion is identified. Musculoskeletal: There is scoliosis of spine. No focal acute abnormalities noted. Review of the MIP images confirms the above findings. CT ABDOMEN and PELVIS FINDINGS Hepatobiliary: No focal liver abnormality is seen. No gallstones, gallbladder wall thickening, or biliary dilatation. Pancreas: Unremarkable. No pancreatic ductal dilatation or surrounding inflammatory changes. Spleen: Normal in size without focal abnormality. Adrenals/Urinary Tract: The adrenal glands are normal. Tiny cyst is identified in the upper pole right  kidney. No left renal lesion is identified. There is no hydronephrosis or nephrolithiasis bilaterally. The bladder is normal. Stomach/Bowel: Extensive free air is identified in the abdomen and pelvis more prominently in the abdomen. This consistent with perforated bowel loop. There is stranding and inflammation surrounding thickened small bowel loops in the lower mid abdomen with marked dilatation of small bowel loops proximal to this area of inflammation. The ileal small bowel loops are normal in caliber but demonstrate abnormal diffuse thickened bowel wall. There is diverticulosis of the colon without definite evidence of diverticulitis. The appendix is normal. Vascular/Lymphatic: Aortic atherosclerosis. No enlarged abdominal or pelvic lymph nodes. Reproductive: Prostate gland is enlarged measuring 6.2 cm in diameter. Other: Minimal umbilical herniation of mesenteric fat is identified. Small amount of free fluid is identified in the pelvis. Musculoskeletal: Degenerative joint changes of the spine are noted. Review of the MIP images confirms the above findings. IMPRESSION: No pulmonary embolus.  Atelectasis of lung bases. Extensive free air identified in the abdomen pelvis consistent with perforated bowel loop. There is stranding and inflammation surrounding thickened small bowel loops in the lower abdomen with marked dilatation of small bowel loops proximal to this area of inflammation suggesting this may the center of the inflammation and possible site of perforation. Critical Value/emergent results were called by telephone at the time of interpretation on 03/23/2018 at 10:01 am to Dr. AMargarita Mail, who verbally acknowledged these results. Electronically Signed   By: WAbelardo DieselM.D.   On: 03/23/2018 10:02   Mr Lumbar Spine Wo Contrast  Result Date: 03/23/2018 CLINICAL DATA:  Dull abdominal pain.  Lumbar spine stenosis EXAM: MRI LUMBAR SPINE WITHOUT CONTRAST TECHNIQUE: Multiplanar, multisequence MR  imaging of the lumbar spine was performed. No intravenous contrast was administered. COMPARISON:  02/28/2018 FINDINGS: Segmentation:  5 lumbar type vertebral bodies Alignment:  Mild levocurvature centered at L4-5 Vertebrae:  No fracture, evidence of discitis, or bone lesion. Conus medullaris and cauda equina: Conus extends to the L1 level. Conus and cauda equina appear normal. Paraspinal and other soft tissues: Dilated fluid-filled small bowel partially covered in the abdomen. There is pneumoperitoneum on acute abdominal series from earlier today. Reference prior acute abdominal series for  documentation of this critical finding. Disc levels: T12- L1: Unremarkable. L1-L2: Small right paracentral disc protrusion without impingement L2-L3: Degenerative posterior element hypertrophy with mild spinal canal narrowing L3-L4: Disc narrowing and circumferential bulging. Posterior element hypertrophy. Moderate to advanced spinal stenosis with near complete effacement of CSF L4-L5: Disc narrowing and endplate degeneration with uncovertebral spurring. Hypertrophic posterior elements. Moderate bilateral foraminal narrowing. Mild spinal stenosis L5-S1:Advanced facet degeneration asymmetric to the right. No herniation. No impingement IMPRESSION: 1. Dilated small bowel with fluid levels and pneumoperitoneum by radiography. Recommend abdomen and pelvis CT. 2. Facet degeneration at L2-3 and below and moderate to advanced disc degeneration at L3-4 and L4-5 with mild levoscoliosis. 3. L3-4 moderate, borderline advanced, spinal stenosis. 4. L4-5 moderate bilateral foraminal narrowing. Electronically Signed   By: Monte Fantasia M.D.   On: 03/23/2018 09:01   Ct Abdomen Pelvis W Contrast  Result Date: 03/23/2018 CLINICAL DATA:  Abdomen pain for 3 days. Assess for diverticulitis. Positive D-dimer test assess for pulmonary embolus. EXAM: CT ANGIOGRAPHY CHEST CT ABDOMEN AND PELVIS WITH CONTRAST TECHNIQUE: Multidetector CT imaging of the  chest was performed using the standard protocol during bolus administration of intravenous contrast. Multiplanar CT image reconstructions and MIPs were obtained to evaluate the vascular anatomy. Multidetector CT imaging of the abdomen and pelvis was performed using the standard protocol during bolus administration of intravenous contrast. CONTRAST:  132m ISOVUE-370 IOPAMIDOL (ISOVUE-370) INJECTION 76% COMPARISON:  None. FINDINGS: CTA CHEST FINDINGS Cardiovascular: Satisfactory opacification of the pulmonary arteries to the segmental level. No evidence of pulmonary embolism. Normal heart size. No pericardial effusion. Mediastinum/Nodes: No enlarged mediastinal, hilar, or axillary lymph nodes. Thyroid gland, trachea, and esophagus demonstrate no significant findings. Lungs/Pleura: There is atelectasis of the lung bases. No focal pneumonia or pleural effusion is identified. Musculoskeletal: There is scoliosis of spine. No focal acute abnormalities noted. Review of the MIP images confirms the above findings. CT ABDOMEN and PELVIS FINDINGS Hepatobiliary: No focal liver abnormality is seen. No gallstones, gallbladder wall thickening, or biliary dilatation. Pancreas: Unremarkable. No pancreatic ductal dilatation or surrounding inflammatory changes. Spleen: Normal in size without focal abnormality. Adrenals/Urinary Tract: The adrenal glands are normal. Tiny cyst is identified in the upper pole right kidney. No left renal lesion is identified. There is no hydronephrosis or nephrolithiasis bilaterally. The bladder is normal. Stomach/Bowel: Extensive free air is identified in the abdomen and pelvis more prominently in the abdomen. This consistent with perforated bowel loop. There is stranding and inflammation surrounding thickened small bowel loops in the lower mid abdomen with marked dilatation of small bowel loops proximal to this area of inflammation. The ileal small bowel loops are normal in caliber but demonstrate  abnormal diffuse thickened bowel wall. There is diverticulosis of the colon without definite evidence of diverticulitis. The appendix is normal. Vascular/Lymphatic: Aortic atherosclerosis. No enlarged abdominal or pelvic lymph nodes. Reproductive: Prostate gland is enlarged measuring 6.2 cm in diameter. Other: Minimal umbilical herniation of mesenteric fat is identified. Small amount of free fluid is identified in the pelvis. Musculoskeletal: Degenerative joint changes of the spine are noted. Review of the MIP images confirms the above findings. IMPRESSION: No pulmonary embolus.  Atelectasis of lung bases. Extensive free air identified in the abdomen pelvis consistent with perforated bowel loop. There is stranding and inflammation surrounding thickened small bowel loops in the lower abdomen with marked dilatation of small bowel loops proximal to this area of inflammation suggesting this may the center of the inflammation and possible site of perforation. Critical Value/emergent results  were called by telephone at the time of interpretation on 03/23/2018 at 10:01 am to Dr. Margarita Mail , who verbally acknowledged these results. Electronically Signed   By: Abelardo Diesel M.D.   On: 03/23/2018 10:02   Dg Abd Acute W/chest  Addendum Date: 03/23/2018   ADDENDUM REPORT: 03/23/2018 08:58 ADDENDUM: On further review, there is felt to be a degree of pneumoperitoneum. Critical Value/emergent results were called by telephone at the time of interpretation on 03/23/2018 at 8:58 am to Dr. Margarita Mail , who verbally acknowledged these results. Electronically Signed   By: Lowella Grip III M.D.   On: 03/23/2018 08:58   Result Date: 03/23/2018 CLINICAL DATA:  Abdominal cramping with nausea and vomiting EXAM: DG ABDOMEN ACUTE W/ 1V CHEST COMPARISON:  None. FINDINGS: PA chest: There is bibasilar atelectasis. Lungs elsewhere clear. Heart size and pulmonary vascularity are normal. No adenopathy. Supine and upright abdomen:  There are multiple loops of dilated small bowel with multiple air-fluid levels. Air is noted in the sigmoid colon and rectum. No evident free air. Vascular calcifications are noted in the pelvis. IMPRESSION: Bowel gas pattern raises concern for a degree of bowel obstruction. Specifically, there are multiple loops of dilated small bowel with air-fluid levels throughout the abdomen. Note that there is a degree of air in colon and rectum. No free air. Bibasilar atelectasis.  Lungs elsewhere clear. Electronically Signed: By: Lowella Grip III M.D. On: 03/23/2018 07:46    Review of Systems  Constitutional: Positive for fever (TM 101 at home). Negative for chills, diaphoresis, malaise/fatigue and weight loss.  HENT: Negative.   Eyes: Negative.   Respiratory: Negative.        Wife notes he snores and does hold his breath sometimes while sleeping.  Cardiovascular: Negative.   Gastrointestinal: Positive for abdominal pain, nausea and vomiting. Negative for blood in stool, constipation, diarrhea, heartburn and melena.       He does have some hemorrhoids  Genitourinary: Negative.        Urine is darker and more concentrated last 24 hours  Musculoskeletal: Positive for back pain (On going started and seen in July for this, being followed by Dr. Trenton Gammon Neurosurgery for this also). Negative for falls, joint pain, myalgias and neck pain.  Skin: Negative.   Neurological: Negative.   Endo/Heme/Allergies: Negative.   Psychiatric/Behavioral: Negative.     Blood pressure 138/79, pulse 84, temperature 98.2 F (36.8 C), temperature source Oral, resp. rate 16, SpO2 98 %. Physical Exam  Constitutional: He is oriented to person, place, and time. He appears well-developed and well-nourished. No distress.  HENT:  Head: Normocephalic and atraumatic.  Mouth/Throat: Oropharyngeal exudate (some white on his tongue) present.  Eyes: Right eye exhibits no discharge. Left eye exhibits no discharge. No scleral icterus.   Pupils are equal   Neck: Normal range of motion. Neck supple. No JVD present. No tracheal deviation present. No thyromegaly present.  Cardiovascular: Regular rhythm, normal heart sounds and intact distal pulses. Exam reveals no gallop.  No murmur heard. Tachycardic ~ 90-100 range  Respiratory: Effort normal and breath sounds normal. No respiratory distress. He has no wheezes. He has no rales. He exhibits no tenderness.  GI: Soft. He exhibits no distension and no mass. There is tenderness (he is most tender RLQ and LLQ areas, no real rebournd or guarding).  Musculoskeletal: He exhibits no edema or tenderness.  Lymphadenopathy:    He has no cervical adenopathy.  Neurological: He is alert and oriented to person, place,  and time. No cranial nerve deficit.  Skin: Skin is warm and dry. No rash noted. He is not diaphoretic. No erythema. No pallor.  Psychiatric: He has a normal mood and affect. His behavior is normal. Judgment and thought content normal.     Assessment/Plan Perforated viscus with pneumoperitoneum - CT points towards bowel perforation Chronic back pain - disc disease and spinal stenosis - Dr. Trenton Gammon Neurosurgery  Plan:  Pt has been started on Zosyn and Vancomycin per Sepsis protocol in the ED.  We will hydrate, and plan exploratory laparotomy ASAP.      Tateanna Bach, PA-C 03/23/2018, 10:30 AM

## 2018-03-23 NOTE — ED Provider Notes (Addendum)
67 yo M here with abd pain, fever, back pain. Initially seen by Arthor CaptainAbigail Harris, PA, and Brock BadLane Molpus, MD. Notified by Radiology and Arthor CaptainAbigail Harris that pt has perforated bowel. On my exam, he is non-toxic, mentating well. BP stable. HR in the 80s. Abdomen does show diffuse TTP, concern for early peritonitis. Ordered CT, which shows perf, likely from small bowel. Surgery consulted. Pt given broad-spectrum ABX and BCx sent x 2. Admit to Surgery. Pt NPO.   CRITICAL CARE Performed by: Dollene Clevelandameron Isascs   Total critical care time: 35 minutes  Critical care time was exclusive of separately billable procedures and treating other patients.  Critical care was necessary to treat or prevent imminent or life-threatening deterioration.  Critical care was time spent personally by me on the following activities: development of treatment plan with patient and/or surrogate as well as nursing, discussions with consultants, evaluation of patient's response to treatment, examination of patient, obtaining history from patient or surrogate, ordering and performing treatments and interventions, ordering and review of laboratory studies, ordering and review of radiographic studies, pulse oximetry and re-evaluation of patient's condition.    Shaune PollackIsaacs, Antoninette Lerner, MD 03/23/18 1022

## 2018-03-23 NOTE — Op Note (Signed)
Procedure Note  Pre-operative Diagnosis:  Perforated viscus  Post-operative Diagnosis:  Perforated sigmoid diverticulitis  Surgeon:  Darnell Levelodd Azavier Creson, MD  Assistant:  Wells GuilesKelly Rayburn, PA   Procedure:  1. Exploratory laparotomy  2. Sigmoid colectomy  3. Descending colostomy  (Hartmann's procedure)  Anesthesia:  general  Estimated Blood Loss:  100cc  Drains: none         Specimen: sigmoid colon to pathology  Indications: Patient is a 67 year old male who presents to the emergency department with 3-day history of abdominal pain.  Evaluation demonstrates free intraperitoneal air consistent with perforated viscus.  CT scan of the abdomen favors a distal small bowel or colonic source.  Patient is prepared urgently and brought to the operating room for exploratory laparotomy.  Procedure Details:  The patient was seen in the pre-op holding area. The risks, benefits, complications, treatment options, and expected outcomes were previously discussed with the patient. The patient agreed with the proposed plan and has signed the informed consent form.  The patient was brought to the Operating Room, identified as Glenn Duncan and the procedure verified. A "time out" was completed and the above information confirmed.  Patient was placed in a supine position on the operating room table.  Following administration of general anesthesia the patient is positioned and prepped and draped in usual aseptic fashion.  After ascertaining an adequate level of anesthesia been achieved, midline incision is made below the level of the umbilicus.  Sections carried through subcutaneous tissues.  Fascia was incised in the midline and the peritoneal cavity was entered cautiously.  There is cloudy pink-colored fluid present within the peritoneal cavity.  The omentum was adhesed in the lower abdomen.  With gentle blunt dissection the omentum was mobilized and a pocket of purulent fluid is entered and evacuated.  Numerous loops of  small bowel were adherent to the sigmoid colon.  Small bowel was gently mobilized.  It shows inflammatory changes but no evidence of perforation.  Examination of the sigmoid colon shows inflammation and a grossly evident perforation in the proximal sigmoid colon.  Balfour retractors placed for exposure.  Small bowel was packed cephalad.  Sigmoid colon was mobilized from its lateral peritoneal attachments.  A point approximately 10 cm proximal to the perforation is selected and transected with a GIA stapler.  Mesentery was divided with the Ethicon Enseal.  Dissection was carried distally to the proximal rectum.  Proximal rectum is transected with a contour stapler.  Perforation in the sigmoid colon is markedly silk suture and the specimen is submitted to pathology for review.  Abdomen is irrigated with saline.  The staple line in the rectum is marked at either end with a 2-0 Prolene suture.  Small bowel was then run from the ligament of Treitz to the ileocecal valve without evidence of perforation or other abnormality other than dilatation.  Abdomen is copiously irrigated with warm saline which is evacuated.  Distal descending colon is mobilized along its peritoneal attachments.  Mesentery is divided enough to allow for mobilization of the colon up to the abdominal wall.  The point on the left abdominal wall was selected and an elliptical incision is made with a #10 blade.  A plug of adipose tissue was excised down to the rectus fascia.  Fascia is incised in a cruciate fashion and dissection carried through the abdominal wall into the peritoneal cavity.  Using a Babcock clamp the bowel was grasped and delivered through the abdominal wall without tension.  Colon is fixed to the  fascia with interrupted 2-0 silk sutures.  Omentum was used to cover the small bowel.  Good hemostasis is noted.  Midline incision is closed with interrupted #1 Novafil simple sutures.  Subcutaneous tissues are subsequently packed with  Betadine soaked gauze.  This is covered with 4 x 4's and ABD pad.  Colostomy is matured by excising the staple line.  The bowel is brought to the skin edges circumferentially with interrupted 3-0 Vicryl sutures.  Colostomy appliance is applied.  Patient is awakened from anesthesia and brought to the recovery room in stable condition.  The patient tolerated the procedure well.   Darnell Levelodd Carlie Corpus, MD [facility] Office: [phone]

## 2018-03-23 NOTE — ED Triage Notes (Signed)
Patient here from home with complaints of abdominal pain (crampy), nausea, vomiting, fever x3 days.

## 2018-03-23 NOTE — Progress Notes (Signed)
A consult was received from an ED physician for vancomycin and zosyn per pharmacy dosing.  The patient's profile has been reviewed for ht/wt/allergies/indication/available labs.    A one time order has been placed for vancomycin 1000 mg IV and zosyn 3.375 gm IV over 30 min .  Further antibiotics/pharmacy consults should be ordered by admitting physician if indicated.                       Thank you, Lucia Gaskinsham, Kamarii Buren P 03/23/2018  7:24 AM

## 2018-03-23 NOTE — Anesthesia Postprocedure Evaluation (Signed)
Anesthesia Post Note  Patient: Glenn Duncan  Procedure(s) Performed: EXPLORATORY LAPAROTOMY, SIGMOIDECTOMY, DIVERTING COLOSTOMY (HARTMANNS PROCEDURE) (N/A )     Patient location during evaluation: PACU Anesthesia Type: General Level of consciousness: awake and alert Pain management: pain level controlled Vital Signs Assessment: post-procedure vital signs reviewed and stable Respiratory status: spontaneous breathing, nonlabored ventilation, respiratory function stable and patient connected to nasal cannula oxygen Cardiovascular status: blood pressure returned to baseline and stable Postop Assessment: no apparent nausea or vomiting Anesthetic complications: no    Last Vitals:  Vitals:   03/23/18 1708 03/23/18 1727  BP: 131/77   Pulse: 82   Resp: 16 18  Temp: 37.2 C   SpO2: 96% 91%    Last Pain:  Vitals:   03/23/18 1727  TempSrc:   PainSc: 5                  Beryle Lathehomas E Syair Fricker

## 2018-03-23 NOTE — Anesthesia Preprocedure Evaluation (Addendum)
Anesthesia Evaluation  Patient identified by MRN, date of birth, ID band Patient awake    Reviewed: Allergy & Precautions, NPO status , Patient's Chart, lab work & pertinent test results  History of Anesthesia Complications Negative for: history of anesthetic complications  Airway Mallampati: IV  TM Distance: <3 FB Neck ROM: Full    Dental  (+) Dental Advisory Given, Teeth Intact   Pulmonary neg pulmonary ROS,    breath sounds clear to auscultation       Cardiovascular Exercise Tolerance: Good negative cardio ROS   Rhythm:Regular Rate:Tachycardia     Neuro/Psych negative neurological ROS  negative psych ROS   GI/Hepatic Neg liver ROS,  Perforated bowel    Endo/Other  negative endocrine ROS  Renal/GU Renal InsufficiencyRenal disease  negative genitourinary   Musculoskeletal  Back pain    Abdominal   Peds  Hematology negative hematology ROS (+)   Anesthesia Other Findings   Reproductive/Obstetrics                            Anesthesia Physical Anesthesia Plan  ASA: I and emergent  Anesthesia Plan: General   Post-op Pain Management:    Induction: Intravenous, Rapid sequence and Cricoid pressure planned  PONV Risk Score and Plan: 4 or greater and Treatment may vary due to age or medical condition, Ondansetron, Dexamethasone and Midazolam  Airway Management Planned: Oral ETT  Additional Equipment: None  Intra-op Plan:   Post-operative Plan: Possible Post-op intubation/ventilation  Informed Consent: I have reviewed the patients History and Physical, chart, labs and discussed the procedure including the risks, benefits and alternatives for the proposed anesthesia with the patient or authorized representative who has indicated his/her understanding and acceptance.   Dental advisory given  Plan Discussed with: CRNA and Anesthesiologist  Anesthesia Plan Comments:         Anesthesia Quick Evaluation

## 2018-03-23 NOTE — ED Provider Notes (Signed)
[facility]-EMERGENCY DEPT Provider Note   CSN: [id number] Arrival date & time: 03/23/18  0603     History   Chief Complaint Chief Complaint  Patient presents with  . Abdominal Pain  . Fever    HPI Alfonzo Beerslbert Bowden is a 67 y.o. male who presents the emergency department with chief complaint of back pain, abdominal pain and fever.  Patient's problems started about a month and a half ago with left hip pain.  He was seen at an outpatient clinic and given steroids and anti-inflammatories.  He then followed up in the emergency department with worsening pain and had a CT scan of the lumbar spine showed significant foraminal stenosis L4-L5, L5-S1 on the left consistent with his complaints of pain radiating into the left hip and pain in the left lateral ankle and foot.  The patient was seen by neurosurgery this past Wednesday.  He did not have any procedures however Thursday the pain in his back and legs became severe.  He is unable to stand for periods of time.  The patient has been taking Vicodin for pain.  He noticed over the past 2 days severe and worsening colicky abdominal pain and abdominal distention.  He states that he has been urinating and defecating normally denies saddle anesthesia or leg weakness however has been in the bed constantly for the past 4 days secondary to both his abdomen and the pain of movement from his spine.  Patient also admits that whenever he tries to stand or move he feels very dizzy.  He denies chest pain or shortness of breath.  He has no previous history of smoking, alcohol abuse, IV drug abuse.  The patient has been running fevers at home and has had chills with a T-max of 101 F.  The patient denies dysuria frequency or urgency, cough, diarrhea, constipation.  HPI  History reviewed. No pertinent past medical history.  There are no active problems to display for this patient.   History reviewed. No pertinent surgical history.      Home  Medications    Prior to Admission medications   Medication Sig Start Date End Date Taking? Authorizing Provider  diclofenac (VOLTAREN) 50 MG EC tablet Take 50 mg by mouth every 8 (eight) hours as needed for mild pain or moderate pain.    [provider]  HYDROcodone-acetaminophen (NORCO/VICODIN) 5-325 MG tablet Take 1 tablet by mouth every 6 (six) hours as needed for severe pain. 02/28/18   Eber HongMiller, Brian, MD  Menthol (BIOFREEZE) 10 % AERO Apply 1 applicator topically daily as needed (pain).    [provider]  naproxen (NAPROSYN) 500 MG tablet Take 1 tablet (500 mg total) by mouth 2 (two) times daily with a meal. 02/28/18   Eber HongMiller, Brian, MD  naproxen sodium (ALEVE) 220 MG tablet Take 440 mg by mouth 2 (two) times daily as needed (pain).    [provider]    Family History No family history on file.  Social History Social History   Tobacco Use  . Smoking status: Never Smoker  . Smokeless tobacco: Never Used  Substance Use Topics  . Alcohol use: Not Currently  . Drug use: Not on file     Allergies   Patient has no known allergies.   Review of Systems Review of Systems  Ten systems reviewed and are negative for acute change, except as noted in the HPI.   Physical Exam Updated Vital Signs BP 115/84 (BP Location: Left Arm)  Pulse (!) 117   Temp 98.5 F (36.9 C) (Oral)   Resp 18   SpO2 98%   Physical Exam  Constitutional: He is oriented to person, place, and time. He appears well-developed and well-nourished. No distress.  HENT:  Head: Normocephalic and atraumatic.  Dry oral mucosa  Eyes: Pupils are equal, round, and reactive to light. Conjunctivae and EOM are normal. No scleral icterus.  Neck: Normal range of motion. Neck supple.  Cardiovascular: Normal rate, regular rhythm and normal heart sounds.  Pulmonary/Chest: Effort normal and breath sounds normal. No respiratory distress.  The patient's oxygen saturation intermittently drops into the  low 80s however responds with deep breathing and rebounds into the high 90s.  Effort appears normal, no abnormal breath sounds.  Abdominal: Soft. Bowel sounds are normal. He exhibits distension. He exhibits no fluid wave and no mass. There is generalized tenderness. There is guarding. There is no rigidity. No hernia.  Musculoskeletal: He exhibits no edema.  Neurological: He is alert and oriented to person, place, and time. He has normal strength. He displays no Babinski's sign on the right side. He displays no Babinski's sign on the left side.  Reflex Scores:      Patellar reflexes are 1+ on the right side and 2+ on the left side. Sensation is normal to light touch the bilateral lower extremities.  Patient has 1+ reflex in the right patellar tendon and 2+ on the left.  Strength is normal to dorsi and plantarflexion.  He has 5 out of 5 strength with hip flexion bilaterally.  Skin: Skin is warm and dry. He is not diaphoretic.  Psychiatric: His behavior is normal.  Nursing note and vitals reviewed.    ED Treatments / Results  Labs (all labs ordered are listed, but only abnormal results are displayed) Labs Reviewed  COMPREHENSIVE METABOLIC PANEL - Abnormal; Notable for the following components:      Result Value   Glucose, Bld 163 (*)    BUN 26 (*)    Creatinine, Ser 1.44 (*)    AST 42 (*)    ALT 48 (*)    GFR calc non Af Amer 49 (*)    GFR calc Af Amer 57 (*)    Anion gap 17 (*)    All other components within normal limits  CBC - Abnormal; Notable for the following components:   WBC 17.2 (*)    RBC 5.89 (*)    All other components within normal limits  I-STAT CHEM 8, ED - Abnormal; Notable for the following components:   Sodium 134 (*)    BUN 28 (*)    Glucose, Bld 160 (*)    Calcium, Ion 1.11 (*)    All other components within normal limits  CULTURE, BLOOD (ROUTINE X 2)  CULTURE, BLOOD (ROUTINE X 2)  LIPASE, BLOOD  URINALYSIS, ROUTINE W REFLEX MICROSCOPIC  D-DIMER,  QUANTITATIVE (NOT AT [facility])  I-STAT CG4 LACTIC ACID, ED    EKG None  Radiology No results found.  Procedures .Critical Care Performed by: Arthor Captain, PA-C Authorized by: Arthor Captain, PA-C   Critical care provider statement:    Critical care time (minutes):  70   Critical care was necessary to treat or prevent imminent or life-threatening deterioration of the following conditions: Acute abdomen.   Critical care was time spent personally by me on the following activities:  Review of old charts, re-evaluation of patient's condition, pulse oximetry, ordering and review of radiographic studies, ordering and review of laboratory  studies, ordering and performing treatments and interventions, obtaining history from patient or surrogate, interpretation of cardiac output measurements, examination of patient, evaluation of patient's response to treatment, discussions with consultants and development of treatment plan with patient or surrogate   (including critical care time)  Medications Ordered in ED Medications  sodium chloride 0.9 % bolus 1,000 mL (1,000 mLs Intravenous New Bag/Given 03/23/18 0703)  LORazepam (ATIVAN) injection 0.5 mg (has no administration in time range)  piperacillin-tazobactam (ZOSYN) IVPB 3.375 g (has no administration in time range)  vancomycin (VANCOCIN) IVPB 1000 mg/200 mL premix (has no administration in time range)  fentaNYL (SUBLIMAZE) injection 50 mcg (50 mcg Intravenous Given 03/23/18 0705)  ondansetron (ZOFRAN) injection 4 mg (4 mg Intravenous Given 03/23/18 0704)     Initial Impression / Assessment and Plan / ED Course  I have reviewed the triage vital signs and the nursing notes.  Pertinent labs & imaging results that were available during my care of the patient were reviewed by me and considered in my medical decision making (see chart for details).  Clinical Course as of Mar 23 1534  [facility]  0709 Patient meets sirs criteria with  elevated white blood cell count, tachycardia.  Lactic is currently pending.  I have ordered blood cultures and broad-spectrum antibiotics.   [AH]  [id number] This is a 67 year old male with back pain, abdominal pain and distention, fever.  He has not had any recent back procedures and does not appear to have signs or symptoms of cauda equina.  He has very minimal risk factor for development of epidural abscess or hematoma and patient has not had any recent procedures to his back.The emergent differential diagnosis for low back pain includes acute ligamentous and injury, cord compression syndrome, pathologic fracture, transverse myelitis, vertebral osteomyelitis, discitis, and epidural abscess.  Given his progressive and severe pain that has been incapacitating we will obtain an MRI of the lumbar spine.     [AH]  [phone] Patient also has abdominal distention, tenderness.  He reports fever at home, has a heart rate of 117 and a white blood cell count of 17,000.  He currently meets sirs criteria and I have ordered blood cultures and broad-spectrum antibiotics.  Lactic acid is within normal limits.  He does appear to have some dehydration with an elevated BUN and a slightly positive anion gap.    [AH]  0720 The causes for generalized abdominal pain include but are not limited to gastritis, gastroenteritis, IBS, pancreatitis, peritonitis, intestinal ischemia, constipation, UTI, intestinal obstruction, perforated viscus, eg, peptic ulcer, appendix, gallbladder, diverticulitis, physical or sexual abuse, abdominal abscess, ruptured spleen, AAA, diabetic ketoacidosis, hypercalcemia, uremia, parasitic or other infection, eg: tapeworms, flukes, Giardia, Cryto, Yersinia, adrenal insufficiency,lead poisoning, iron toxicity, polyarteritis nodosa, Henoch-Schnlein purpura, porphyria, eg, acute intermittent porphyria, familial Mediterranean fever.  I have ordered an acute abdominal series to evaluate for potential constipation  however I suspect patient may need further work-up with CT abdomen.     [AH]  0721 Given the patient's recent inactivity and immobilization, tachycardia and intermittent hypoxia I have also considered pneumonia, atelectasis, and pulmonary embolus especially with feelings of dizziness with standing.  I have ordered a d-dimer which is currently pending.   [AH]  [id number] Patient's d-dimer markedly elevated.  I have ordered a CT angios chest to rule out pulmonary embolus along with CT abdomen and pelvis.  D-Dimer, Quant(!): 3.99 [AH]  [id number] Patient with lateral ring pattern of the small bowel with dilated air fluid  levels concerning for potential bowel obstruction.  I have also ordered a CT abdomen and pelvis with contrast.  The patient denies vomiting.  DG Abd Acute W/Chest [AH]  1610 I just got a phone call from Dr. Margarita Grizzle in radiology who was reviewing his previously read acute abdominal films and states that he thinks he sees free air under the diaphragm and called me emergently with this information.   [AH]  [phone] Patient MRI shows sig degenerative changes and spinal stenosis. Pneumoperitoneum noted.   [AH]    Clinical Course User Index [AH] Arthor Captain, PA-C    Patient with apparent acute bowel perforation in the setting of obstruction.  His lumbar spine issues appear chronic without signs of cauda equina or other significant cord compression or myelopathy.  The patient does have acute bowel perforation with small bowel obstruction, diffuse free peritoneal air on multiple images.  Consulted with surgery immediately who came to see the patient and will take him to the OR.  Patient has been seen in shared visit with Dr. Erma Heritage.  Final Clinical Impressions(s) / ED Diagnoses   Final diagnoses:  Nasogastric tube present    ED Discharge Orders    None       Arthor Captain, PA-C 03/23/18 1620    Shaune Pollack, MD 03/24/18 [phone]

## 2018-03-23 NOTE — ED Notes (Signed)
ED TO INPATIENT HANDOFF REPORT  Name/Age/Gender Glenn Duncan 67 y.o. male  Code Status    Code Status Orders  (From admission, onward)         Start     Ordered   03/23/18 1031  Full code  Continuous     03/23/18 1036        Code Status History    This patient has a current code status but no historical code status.      Home/SNF/Other Home  Chief Complaint Abdominal Pain;Fever  Level of Care/Admitting Diagnosis ED Disposition    ED Disposition Condition Comment   Admit  Hospital Area: [facility] [[id number]]  Level of Care: Med-Surg [16]  Diagnosis: Perforated viscus [361224]  Admitting Physician: [place], [place]  Attending Physician: CCS, MD [[id number]]  Estimated length of stay: 3 - 4 days  Certification:: I certify this patient will need inpatient services for at least 2 midnights  PT Class (Do Not Modify): Inpatient [101]  PT Acc Code (Do Not Modify): Private [1]       Medical History History reviewed. No pertinent past medical history.  Allergies No Known Allergies  IV Location/Drains/Wounds Patient Lines/Drains/Airways Status   Active Line/Drains/Airways    Name:   Placement date:   Placement time:   Site:   Days:   Peripheral IV 03/23/18 Left Antecubital   03/23/18    4975    Antecubital   less than 1          Labs/Imaging Results for orders placed or performed during the hospital encounter of 03/23/18 (from the past 48 hour(s))  Lipase, blood     Status: None   Collection Time: 03/23/18  6:18 AM  Result Value Ref Range   Lipase 22 11 - 51 U/L    Comment: Performed at [facility], [place] [address] [place], [place] [address]  Comprehensive metabolic panel     Status: Abnormal   Collection Time: 03/23/18  6:18 AM  Result Value Ref Range   Sodium 139 135 - 145 mmol/L   Potassium 4.4 3.5 - 5.1 mmol/L   Chloride 98 98 - 111 mmol/L   CO2 24 22 - 32 mmol/L   Glucose, Bld 163 (H) 70 - 99 mg/dL   BUN 26 (H)  8 - 23 mg/dL   Creatinine, Ser 1.44 (H) 0.61 - 1.24 mg/dL   Calcium 10.0 8.9 - 10.3 mg/dL   Total Protein 8.0 6.5 - 8.1 g/dL   Albumin 3.9 3.5 - 5.0 g/dL   AST 42 (H) 15 - 41 U/L   ALT 48 (H) 0 - 44 U/L   Alkaline Phosphatase 93 38 - 126 U/L   Total Bilirubin 1.2 0.3 - 1.2 mg/dL   GFR calc non Af Amer 49 (L) >60 mL/min   GFR calc Af Amer 57 (L) >60 mL/min    Comment: (NOTE) The eGFR has been calculated using the CKD EPI equation. This calculation has not been validated in all clinical situations. eGFR's persistently <60 mL/min signify possible Chronic Kidney Disease.    Anion gap 17 (H) 5 - 15    Comment: Performed at [facility], [place] [address] [place], [place] [address]  CBC     Status: Abnormal   Collection Time: 03/23/18  6:18 AM  Result Value Ref Range   WBC 17.2 (H) 4.0 - 10.5 K/uL   RBC 5.89 (H) 4.22 - 5.81 MIL/uL   Hemoglobin 16.2 13.0 - 17.0 g/dL  HCT 48.0 39.0 - 52.0 %   MCV 81.5 78.0 - 100.0 fL   MCH 27.5 26.0 - 34.0 pg   MCHC 33.8 30.0 - 36.0 g/dL   RDW 14.5 11.5 - 15.5 %   Platelets 253 150 - 400 K/uL    Comment: Performed at [facility], [place] [address], [place], [place] [address]  D-dimer, quantitative (not at [facility])     Status: Abnormal   Collection Time: 03/23/18  7:07 AM  Result Value Ref Range   D-Dimer, Quant 3.99 (H) 0.00 - 0.50 ug/mL-FEU    Comment: (NOTE) At the manufacturer cut-off of 0.50 ug/mL FEU, this assay has been documented to exclude PE with a sensitivity and negative predictive value of 97 to 99%.  At this time, this assay has not been approved by the FDA to exclude DVT/VTE. Results should be correlated with clinical presentation. Performed at [facility], [place] [address] [place], [place] [address]   I-stat chem 8, ed     Status: Abnormal   Collection Time: 03/23/18  7:13 AM  Result Value Ref Range   Sodium 134 (L) 135 - 145 mmol/L   Potassium 3.9 3.5 - 5.1 mmol/L   Chloride  98 98 - 111 mmol/L   BUN 28 (H) 8 - 23 mg/dL   Creatinine, Ser 1.20 0.61 - 1.24 mg/dL   Glucose, Bld 160 (H) 70 - 99 mg/dL   Calcium, Ion 1.11 (L) 1.15 - 1.40 mmol/L   TCO2 25 22 - 32 mmol/L   Hemoglobin 16.3 13.0 - 17.0 g/dL   HCT 48.0 39.0 - 52.0 %  I-Stat CG4 Lactic Acid, ED     Status: None   Collection Time: 03/23/18  7:14 AM  Result Value Ref Range   Lactic Acid, Venous 1.88 0.5 - 1.9 mmol/L   Ct Angio Chest Pe W And/or Wo Contrast  Result Date: 03/23/2018 CLINICAL DATA:  Abdomen pain for 3 days. Assess for diverticulitis. Positive D-dimer test assess for pulmonary embolus. EXAM: CT ANGIOGRAPHY CHEST CT ABDOMEN AND PELVIS WITH CONTRAST TECHNIQUE: Multidetector CT imaging of the chest was performed using the standard protocol during bolus administration of intravenous contrast. Multiplanar CT image reconstructions and MIPs were obtained to evaluate the vascular anatomy. Multidetector CT imaging of the abdomen and pelvis was performed using the standard protocol during bolus administration of intravenous contrast. CONTRAST:  135m ISOVUE-370 IOPAMIDOL (ISOVUE-370) INJECTION 76% COMPARISON:  None. FINDINGS: CTA CHEST FINDINGS Cardiovascular: Satisfactory opacification of the pulmonary arteries to the segmental level. No evidence of pulmonary embolism. Normal heart size. No pericardial effusion. Mediastinum/Nodes: No enlarged mediastinal, hilar, or axillary lymph nodes. Thyroid gland, trachea, and esophagus demonstrate no significant findings. Lungs/Pleura: There is atelectasis of the lung bases. No focal pneumonia or pleural effusion is identified. Musculoskeletal: There is scoliosis of spine. No focal acute abnormalities noted. Review of the MIP images confirms the above findings. CT ABDOMEN and PELVIS FINDINGS Hepatobiliary: No focal liver abnormality is seen. No gallstones, gallbladder wall thickening, or biliary dilatation. Pancreas: Unremarkable. No pancreatic ductal dilatation or surrounding  inflammatory changes. Spleen: Normal in size without focal abnormality. Adrenals/Urinary Tract: The adrenal glands are normal. Tiny cyst is identified in the upper pole right kidney. No left renal lesion is identified. There is no hydronephrosis or nephrolithiasis bilaterally. The bladder is normal. Stomach/Bowel: Extensive free air is identified in the abdomen and pelvis more prominently in the abdomen. This consistent with perforated bowel loop. There is stranding and inflammation surrounding thickened  small bowel loops in the lower mid abdomen with marked dilatation of small bowel loops proximal to this area of inflammation. The ileal small bowel loops are normal in caliber but demonstrate abnormal diffuse thickened bowel wall. There is diverticulosis of the colon without definite evidence of diverticulitis. The appendix is normal. Vascular/Lymphatic: Aortic atherosclerosis. No enlarged abdominal or pelvic lymph nodes. Reproductive: Prostate gland is enlarged measuring 6.2 cm in diameter. Other: Minimal umbilical herniation of mesenteric fat is identified. Small amount of free fluid is identified in the pelvis. Musculoskeletal: Degenerative joint changes of the spine are noted. Review of the MIP images confirms the above findings. IMPRESSION: No pulmonary embolus.  Atelectasis of lung bases. Extensive free air identified in the abdomen pelvis consistent with perforated bowel loop. There is stranding and inflammation surrounding thickened small bowel loops in the lower abdomen with marked dilatation of small bowel loops proximal to this area of inflammation suggesting this may the center of the inflammation and possible site of perforation. Critical Value/emergent results were called by telephone at the time of interpretation on 03/23/2018 at 10:01 am to Dr. Margarita Mail , who verbally acknowledged these results. Electronically Signed   By: Abelardo Diesel M.D.   On: 03/23/2018 10:02   Mr Lumbar Spine Wo  Contrast  Result Date: 03/23/2018 CLINICAL DATA:  Dull abdominal pain.  Lumbar spine stenosis EXAM: MRI LUMBAR SPINE WITHOUT CONTRAST TECHNIQUE: Multiplanar, multisequence MR imaging of the lumbar spine was performed. No intravenous contrast was administered. COMPARISON:  02/28/2018 FINDINGS: Segmentation:  5 lumbar type vertebral bodies Alignment:  Mild levocurvature centered at L4-5 Vertebrae:  No fracture, evidence of discitis, or bone lesion. Conus medullaris and cauda equina: Conus extends to the L1 level. Conus and cauda equina appear normal. Paraspinal and other soft tissues: Dilated fluid-filled small bowel partially covered in the abdomen. There is pneumoperitoneum on acute abdominal series from earlier today. Reference prior acute abdominal series for documentation of this critical finding. Disc levels: T12- L1: Unremarkable. L1-L2: Small right paracentral disc protrusion without impingement L2-L3: Degenerative posterior element hypertrophy with mild spinal canal narrowing L3-L4: Disc narrowing and circumferential bulging. Posterior element hypertrophy. Moderate to advanced spinal stenosis with near complete effacement of CSF L4-L5: Disc narrowing and endplate degeneration with uncovertebral spurring. Hypertrophic posterior elements. Moderate bilateral foraminal narrowing. Mild spinal stenosis L5-S1:Advanced facet degeneration asymmetric to the right. No herniation. No impingement IMPRESSION: 1. Dilated small bowel with fluid levels and pneumoperitoneum by radiography. Recommend abdomen and pelvis CT. 2. Facet degeneration at L2-3 and below and moderate to advanced disc degeneration at L3-4 and L4-5 with mild levoscoliosis. 3. L3-4 moderate, borderline advanced, spinal stenosis. 4. L4-5 moderate bilateral foraminal narrowing. Electronically Signed   By: Monte Fantasia M.D.   On: 03/23/2018 09:01   Ct Abdomen Pelvis W Contrast  Result Date: 03/23/2018 CLINICAL DATA:  Abdomen pain for 3 days. Assess  for diverticulitis. Positive D-dimer test assess for pulmonary embolus. EXAM: CT ANGIOGRAPHY CHEST CT ABDOMEN AND PELVIS WITH CONTRAST TECHNIQUE: Multidetector CT imaging of the chest was performed using the standard protocol during bolus administration of intravenous contrast. Multiplanar CT image reconstructions and MIPs were obtained to evaluate the vascular anatomy. Multidetector CT imaging of the abdomen and pelvis was performed using the standard protocol during bolus administration of intravenous contrast. CONTRAST:  133m ISOVUE-370 IOPAMIDOL (ISOVUE-370) INJECTION 76% COMPARISON:  None. FINDINGS: CTA CHEST FINDINGS Cardiovascular: Satisfactory opacification of the pulmonary arteries to the segmental level. No evidence of pulmonary embolism. Normal heart size. No  pericardial effusion. Mediastinum/Nodes: No enlarged mediastinal, hilar, or axillary lymph nodes. Thyroid gland, trachea, and esophagus demonstrate no significant findings. Lungs/Pleura: There is atelectasis of the lung bases. No focal pneumonia or pleural effusion is identified. Musculoskeletal: There is scoliosis of spine. No focal acute abnormalities noted. Review of the MIP images confirms the above findings. CT ABDOMEN and PELVIS FINDINGS Hepatobiliary: No focal liver abnormality is seen. No gallstones, gallbladder wall thickening, or biliary dilatation. Pancreas: Unremarkable. No pancreatic ductal dilatation or surrounding inflammatory changes. Spleen: Normal in size without focal abnormality. Adrenals/Urinary Tract: The adrenal glands are normal. Tiny cyst is identified in the upper pole right kidney. No left renal lesion is identified. There is no hydronephrosis or nephrolithiasis bilaterally. The bladder is normal. Stomach/Bowel: Extensive free air is identified in the abdomen and pelvis more prominently in the abdomen. This consistent with perforated bowel loop. There is stranding and inflammation surrounding thickened small bowel loops in  the lower mid abdomen with marked dilatation of small bowel loops proximal to this area of inflammation. The ileal small bowel loops are normal in caliber but demonstrate abnormal diffuse thickened bowel wall. There is diverticulosis of the colon without definite evidence of diverticulitis. The appendix is normal. Vascular/Lymphatic: Aortic atherosclerosis. No enlarged abdominal or pelvic lymph nodes. Reproductive: Prostate gland is enlarged measuring 6.2 cm in diameter. Other: Minimal umbilical herniation of mesenteric fat is identified. Small amount of free fluid is identified in the pelvis. Musculoskeletal: Degenerative joint changes of the spine are noted. Review of the MIP images confirms the above findings. IMPRESSION: No pulmonary embolus.  Atelectasis of lung bases. Extensive free air identified in the abdomen pelvis consistent with perforated bowel loop. There is stranding and inflammation surrounding thickened small bowel loops in the lower abdomen with marked dilatation of small bowel loops proximal to this area of inflammation suggesting this may the center of the inflammation and possible site of perforation. Critical Value/emergent results were called by telephone at the time of interpretation on 03/23/2018 at 10:01 am to Dr. Margarita Mail , who verbally acknowledged these results. Electronically Signed   By: Abelardo Diesel M.D.   On: 03/23/2018 10:02   Dg Abd Acute W/chest  Addendum Date: 03/23/2018   ADDENDUM REPORT: 03/23/2018 08:58 ADDENDUM: On further review, there is felt to be a degree of pneumoperitoneum. Critical Value/emergent results were called by telephone at the time of interpretation on 03/23/2018 at 8:58 am to Dr. Margarita Mail , who verbally acknowledged these results. Electronically Signed   By: Lowella Grip III M.D.   On: 03/23/2018 08:58   Result Date: 03/23/2018 CLINICAL DATA:  Abdominal cramping with nausea and vomiting EXAM: DG ABDOMEN ACUTE W/ 1V CHEST COMPARISON:   None. FINDINGS: PA chest: There is bibasilar atelectasis. Lungs elsewhere clear. Heart size and pulmonary vascularity are normal. No adenopathy. Supine and upright abdomen: There are multiple loops of dilated small bowel with multiple air-fluid levels. Air is noted in the sigmoid colon and rectum. No evident free air. Vascular calcifications are noted in the pelvis. IMPRESSION: Bowel gas pattern raises concern for a degree of bowel obstruction. Specifically, there are multiple loops of dilated small bowel with air-fluid levels throughout the abdomen. Note that there is a degree of air in colon and rectum. No free air. Bibasilar atelectasis.  Lungs elsewhere clear. Electronically Signed: By: Lowella Grip III M.D. On: 03/23/2018 07:46    Pending Labs [employer] (From admission, onward)    Start     Ordered  03/24/18 0500  Comprehensive metabolic panel  Tomorrow morning,   R     03/23/18 1036   03/24/18 0500  CBC  Tomorrow morning,   R     03/23/18 1036   03/24/18 0500  H. pylori antibody, IgG  Tomorrow morning,   R     03/23/18 1037   03/23/18 1034  Magnesium  Add-on,   R     03/23/18 1036   03/23/18 1031  HIV antibody (Routine Testing)  Once,   R     03/23/18 1036   03/23/18 0709  Blood Culture (routine x 2)  BLOOD CULTURE X 2,   STAT     03/23/18 0709   03/23/18 0611  Urinalysis, Routine w reflex microscopic  STAT,   STAT     03/23/18 0610          Vitals/Pain Today's Vitals   03/23/18 0800 03/23/18 0818 03/23/18 0939 03/23/18 0943  BP: 133/71   138/79  Pulse: 85 78 78 84  Resp: (!) 25 15 16 16   Temp:  98.2 F (36.8 C)    TempSrc:  Oral    SpO2: 93% 100%  98%  PainSc:        Isolation Precautions No active isolations  Medications Medications  LORazepam (ATIVAN) injection 0.5 mg (has no administration in time range)  iopamidol (ISOVUE-370) 76 % injection (has no administration in time range)  famotidine (PEPCID) IVPB 20 mg premix (has no administration in time  range)  0.9 %  sodium chloride infusion (has no administration in time range)  cefoTEtan (CEFOTAN) 2 g in sodium chloride 0.9 % 100 mL IVPB (has no administration in time range)  morphine 2 MG/ML injection 1-4 mg (has no administration in time range)  diphenhydrAMINE (BENADRYL) 12.5 MG/5ML elixir 12.5 mg (has no administration in time range)    Or  diphenhydrAMINE (BENADRYL) injection 12.5 mg (has no administration in time range)  ondansetron (ZOFRAN-ODT) disintegrating tablet 4 mg (has no administration in time range)    Or  ondansetron (ZOFRAN) injection 4 mg (has no administration in time range)  fentaNYL (SUBLIMAZE) injection 50 mcg (50 mcg Intravenous Given 03/23/18 0705)  ondansetron (ZOFRAN) injection 4 mg (4 mg Intravenous Given 03/23/18 0704)  sodium chloride 0.9 % bolus 1,000 mL (0 mLs Intravenous Stopped 03/23/18 0814)  piperacillin-tazobactam (ZOSYN) IVPB 3.375 g (0 g Intravenous Stopped 03/23/18 0814)  vancomycin (VANCOCIN) IVPB 1000 mg/200 mL premix (0 mg Intravenous Stopped 03/23/18 0922)  iopamidol (ISOVUE-370) 76 % injection 100 mL (100 mLs Intravenous Contrast Given 03/23/18 0925)    Mobility walks

## 2018-03-23 NOTE — ED Notes (Signed)
ED Provider at bedside. 

## 2018-03-23 NOTE — Transfer of Care (Signed)
Immediate Anesthesia Transfer of Care Note  Patient: Glenn Duncan  Procedure(s) Performed: Procedure(s): EXPLORATORY LAPAROTOMY, SIGMOIDECTOMY, DIVERTING COLOSTOMY (HARTMANNS PROCEDURE) (N/A)  Patient Location: PACU  Anesthesia Type:General  Level of Consciousness:  sedated, patient cooperative and responds to stimulation  Airway & Oxygen Therapy:Patient Spontanous Breathing and Patient connected to face mask oxgen  Post-op Assessment:  Report given to PACU RN and Post -op Vital signs reviewed and stable  Post vital signs:  Reviewed and stable  Last Vitals:  Vitals:   03/23/18 1100 03/23/18 1337  BP: 134/83 (!) 150/77  Pulse: 88 91  Resp:  14  Temp:    SpO2: 96% 97%    Complications: No apparent anesthesia complications

## 2018-03-23 NOTE — Anesthesia Procedure Notes (Signed)
Procedure Name: Intubation Date/Time: 03/23/2018 2:09 PM Performed by: Anne Fu, CRNA Pre-anesthesia Checklist: Patient identified, Emergency Drugs available, Suction available, Patient being monitored and Timeout performed Patient Re-evaluated:Patient Re-evaluated prior to induction Oxygen Delivery Method: Circle system utilized Preoxygenation: Pre-oxygenation with 100% oxygen Induction Type: IV induction, Cricoid Pressure applied and Rapid sequence Laryngoscope Size: Mac and 4 Grade View: Grade I Tube type: Oral Tube size: 7.5 mm Number of attempts: 1 Airway Equipment and Method: Stylet Placement Confirmation: ETT inserted through vocal cords under direct vision,  positive ETCO2 and breath sounds checked- equal and bilateral Secured at: 21 cm Tube secured with: Tape Dental Injury: Teeth and Oropharynx as per pre-operative assessment

## 2018-03-24 ENCOUNTER — Encounter (HOSPITAL_COMMUNITY): Payer: Self-pay | Admitting: Surgery

## 2018-03-24 LAB — CBC
HEMATOCRIT: 37.6 % — AB (ref 39.0–52.0)
HEMOGLOBIN: 12.3 g/dL — AB (ref 13.0–17.0)
MCH: 26.9 pg (ref 26.0–34.0)
MCHC: 32.7 g/dL (ref 30.0–36.0)
MCV: 82.1 fL (ref 78.0–100.0)
PLATELETS: 203 10*3/uL (ref 150–400)
RBC: 4.58 MIL/uL (ref 4.22–5.81)
RDW: 14.5 % (ref 11.5–15.5)
WBC: 7.5 10*3/uL (ref 4.0–10.5)

## 2018-03-24 LAB — COMPREHENSIVE METABOLIC PANEL
ALT: 26 U/L (ref 0–44)
ANION GAP: 8 (ref 5–15)
AST: 19 U/L (ref 15–41)
Albumin: 2.6 g/dL — ABNORMAL LOW (ref 3.5–5.0)
Alkaline Phosphatase: 57 U/L (ref 38–126)
BUN: 19 mg/dL (ref 8–23)
CHLORIDE: 103 mmol/L (ref 98–111)
CO2: 27 mmol/L (ref 22–32)
CREATININE: 1.01 mg/dL (ref 0.61–1.24)
Calcium: 8.5 mg/dL — ABNORMAL LOW (ref 8.9–10.3)
Glucose, Bld: 147 mg/dL — ABNORMAL HIGH (ref 70–99)
POTASSIUM: 4.2 mmol/L (ref 3.5–5.1)
SODIUM: 138 mmol/L (ref 135–145)
Total Bilirubin: 0.9 mg/dL (ref 0.3–1.2)
Total Protein: 5.7 g/dL — ABNORMAL LOW (ref 6.5–8.1)

## 2018-03-24 LAB — HIV ANTIBODY (ROUTINE TESTING W REFLEX): HIV SCREEN 4TH GENERATION: NONREACTIVE

## 2018-03-24 MED ORDER — PHENOL 1.4 % MT LIQD
1.0000 | OROMUCOSAL | Status: DC | PRN
  Administered 2018-03-24: 1 via OROMUCOSAL
  Filled 2018-03-24: qty 177

## 2018-03-24 NOTE — Consult Note (Addendum)
WOC Nurse ostomy consult note Stoma type/location:  Consult requested for new colostomy; surgery was performed 8/12 Stomal assessment/size: Stoma is red and viable when visualized through the pouch, which is intact with a good seal. Output: No stool or flatus in the pouch  Ostomy pouching: 2pc.  Education provided:  Reviewed pouching routines.  Plan to perform pouch change demonstration and educational session tomorrow at 11:00 as requested by the patient and his wife. Enrolled patient in [employer] program: No Supplies at the bedside for staff nurses use. Cammie Mcgeeawn Caroleann Casler MSN, RN, CWOCN, [place], CNS [phone]

## 2018-03-24 NOTE — Progress Notes (Signed)
[facility] Progress Note  1 Day Post-Op  Subjective: CC: no complaints Patient with mild abdominal pain only, has only pushed PCA a few times. Burping some, no gas or stool from ostomy yet. Denies nausea, just sore throat from tube. Patient ambulated last night and hoping to walk more today. Very grateful for care.   Objective: Vital signs in last 24 hours: Temp:  [97.1 F (36.2 C)-99.5 F (37.5 C)] 98 F (36.7 C) (08/13 0912) Pulse Rate:  [67-91] 67 (08/13 0912) Resp:  [11-20] 16 (08/13 0912) BP: (118-150)/(49-83) 125/72 (08/13 0912) SpO2:  [91 %-100 %] 97 % (08/13 0912) Weight:  [77.1 kg] 77.1 kg (08/12 1100) Last BM Date: 03/21/18  Intake/Output from previous day: 08/12 0701 - 08/13 0700 In: 9612.5 [P.O.:120; I.V.:3259.2; IV Piggyback:6233.4] Out: 2575 [Urine:2425; Emesis/NG output:50; Blood:100] Intake/Output this shift: Total I/O In: -  Out: 175 [Urine:175]  PE: Gen:  Alert, NAD, pleasant Card:  Regular rate and rhythm, pedal pulses 2+ BL Pulm:  Normal effort, clear to auscultation bilaterally Abd: Soft, minimally tender, non-distended, bowel sounds hypoactive, no HSM, midline wound clean with good granulation and no purulence noted, stoma pink with condensation in ostomy Skin: warm and dry, no rashes  Psych: A&Ox3   Lab Results:  Recent Labs    03/23/18 0618 03/23/18 0713 03/24/18 0425  WBC 17.2*  --  7.5  HGB 16.2 16.3 12.3*  HCT 48.0 48.0 37.6*  PLT 253  --  203   BMET Recent Labs    03/23/18 0618 03/23/18 0713 03/24/18 0425  NA 139 134* 138  K 4.4 3.9 4.2  CL 98 98 103  CO2 24  --  27  GLUCOSE 163* 160* 147*  BUN 26* 28* 19  CREATININE 1.44* 1.20 1.01  CALCIUM 10.0  --  8.5*   PT/INR No results for input(s): LABPROT, INR in the last 72 hours. CMP     Component Value Date/Time   NA 138 03/24/2018 0425   K 4.2 03/24/2018 0425   CL 103 03/24/2018 0425   CO2 27 03/24/2018 0425   GLUCOSE 147 (H) 03/24/2018 0425   BUN 19  03/24/2018 0425   CREATININE 1.01 03/24/2018 0425   CALCIUM 8.5 (L) 03/24/2018 0425   PROT 5.7 (L) 03/24/2018 0425   ALBUMIN 2.6 (L) 03/24/2018 0425   AST 19 03/24/2018 0425   ALT 26 03/24/2018 0425   ALKPHOS 57 03/24/2018 0425   BILITOT 0.9 03/24/2018 0425   GFRNONAA >60 03/24/2018 0425   GFRAA >60 03/24/2018 0425   Lipase     Component Value Date/Time   LIPASE 22 03/23/2018 0618       Studies/Results: X-ray Abdomen Ap  Result Date: 03/23/2018 CLINICAL DATA:  NG tube placement. Status post abdominal surgery today. EXAM: ABDOMEN - 1 VIEW COMPARISON:  03/23/2018 CT FINDINGS: An NG tube is identified with tip overlying the proximal-mid stomach. Dilated small bowel loops are again noted. Bibasilar atelectasis is identified. No other significant changes noted. IMPRESSION: NG tube with tip overlying the proximal-mid stomach. Dilated small bowel loops again noted. Electronically Signed   By: Harmon Pier M.D.   On: 03/23/2018 16:34   Ct Angio Chest Pe W And/or Wo Contrast  Result Date: 03/23/2018 CLINICAL DATA:  Abdomen pain for 3 days. Assess for diverticulitis. Positive D-dimer test assess for pulmonary embolus. EXAM: CT ANGIOGRAPHY CHEST CT ABDOMEN AND PELVIS WITH CONTRAST TECHNIQUE: Multidetector CT imaging of the chest was performed using the standard protocol during bolus administration of  intravenous contrast. Multiplanar CT image reconstructions and MIPs were obtained to evaluate the vascular anatomy. Multidetector CT imaging of the abdomen and pelvis was performed using the standard protocol during bolus administration of intravenous contrast. CONTRAST:  ISOVUE-370 IOPAMIDOL (ISOVUE-370) INJECTION 76% COMPARISON:  None. FINDINGS: CTA CHEST FINDINGS Cardiovascular: Satisfactory opacification of the pulmonary arteries to the segmental level. No evidence of pulmonary embolism. Normal heart size. No pericardial effusion. Mediastinum/Nodes: No enlarged mediastinal, hilar, or axillary  lymph nodes. Thyroid gland, trachea, and esophagus demonstrate no significant findings. Lungs/Pleura: There is atelectasis of the lung bases. No focal pneumonia or pleural effusion is identified. Musculoskeletal: There is scoliosis of spine. No focal acute abnormalities noted. Review of the MIP images confirms the above findings. CT ABDOMEN and PELVIS FINDINGS Hepatobiliary: No focal liver abnormality is seen. No gallstones, gallbladder wall thickening, or biliary dilatation. Pancreas: Unremarkable. No pancreatic ductal dilatation or surrounding inflammatory changes. Spleen: Normal in size without focal abnormality. Adrenals/Urinary Tract: The adrenal glands are normal. Tiny cyst is identified in the upper pole right kidney. No left renal lesion is identified. There is no hydronephrosis or nephrolithiasis bilaterally. The bladder is normal. Stomach/Bowel: Extensive free air is identified in the abdomen and pelvis more prominently in the abdomen. This consistent with perforated bowel loop. There is stranding and inflammation surrounding thickened small bowel loops in the lower mid abdomen with marked dilatation of small bowel loops proximal to this area of inflammation. The ileal small bowel loops are normal in caliber but demonstrate abnormal diffuse thickened bowel wall. There is diverticulosis of the colon without definite evidence of diverticulitis. The appendix is normal. Vascular/Lymphatic: Aortic atherosclerosis. No enlarged abdominal or pelvic lymph nodes. Reproductive: Prostate gland is enlarged measuring 6.2 cm in diameter. Other: Minimal umbilical herniation of mesenteric fat is identified. Small amount of free fluid is identified in the pelvis. Musculoskeletal: Degenerative joint changes of the spine are noted. Review of the MIP images confirms the above findings. IMPRESSION: No pulmonary embolus.  Atelectasis of lung bases. Extensive free air identified in the abdomen pelvis consistent with perforated  bowel loop. There is stranding and inflammation surrounding thickened small bowel loops in the lower abdomen with marked dilatation of small bowel loops proximal to this area of inflammation suggesting this may the center of the inflammation and possible site of perforation. Critical Value/emergent results were called by telephone at the time of interpretation on 03/23/2018 at 10:01 am to Dr. Arthor Captain , who verbally acknowledged these results. Electronically Signed   By: Sherian Rein M.D.   On: 03/23/2018 10:02   Mr Lumbar Spine Wo Contrast  Result Date: 03/23/2018 CLINICAL DATA:  Dull abdominal pain.  Lumbar spine stenosis EXAM: MRI LUMBAR SPINE WITHOUT CONTRAST TECHNIQUE: Multiplanar, multisequence MR imaging of the lumbar spine was performed. No intravenous contrast was administered. COMPARISON:  02/28/2018 FINDINGS: Segmentation:  5 lumbar type vertebral bodies Alignment:  Mild levocurvature centered at L4-5 Vertebrae:  No fracture, evidence of discitis, or bone lesion. Conus medullaris and cauda equina: Conus extends to the L1 level. Conus and cauda equina appear normal. Paraspinal and other soft tissues: Dilated fluid-filled small bowel partially covered in the abdomen. There is pneumoperitoneum on acute abdominal series from earlier today. Reference prior acute abdominal series for documentation of this critical finding. Disc levels: T12- L1: Unremarkable. L1-L2: Small right paracentral disc protrusion without impingement L2-L3: Degenerative posterior element hypertrophy with mild spinal canal narrowing L3-L4: Disc narrowing and circumferential bulging. Posterior element hypertrophy. Moderate to advanced spinal stenosis  with near complete effacement of CSF L4-L5: Disc narrowing and endplate degeneration with uncovertebral spurring. Hypertrophic posterior elements. Moderate bilateral foraminal narrowing. Mild spinal stenosis L5-S1:Advanced facet degeneration asymmetric to the right. No herniation. No  impingement IMPRESSION: 1. Dilated small bowel with fluid levels and pneumoperitoneum by radiography. Recommend abdomen and pelvis CT. 2. Facet degeneration at L2-3 and below and moderate to advanced disc degeneration at L3-4 and L4-5 with mild levoscoliosis. 3. L3-4 moderate, borderline advanced, spinal stenosis. 4. L4-5 moderate bilateral foraminal narrowing. Electronically Signed   By: Marnee SpringJonathon  Watts M.D.   On: 03/23/2018 09:01   Ct Abdomen Pelvis W Contrast  Result Date: 03/23/2018 CLINICAL DATA:  Abdomen pain for 3 days. Assess for diverticulitis. Positive D-dimer test assess for pulmonary embolus. EXAM: CT ANGIOGRAPHY CHEST CT ABDOMEN AND PELVIS WITH CONTRAST TECHNIQUE: Multidetector CT imaging of the chest was performed using the standard protocol during bolus administration of intravenous contrast. Multiplanar CT image reconstructions and MIPs were obtained to evaluate the vascular anatomy. Multidetector CT imaging of the abdomen and pelvis was performed using the standard protocol during bolus administration of intravenous contrast. CONTRAST:  100mL ISOVUE-370 IOPAMIDOL (ISOVUE-370) INJECTION 76% COMPARISON:  None. FINDINGS: CTA CHEST FINDINGS Cardiovascular: Satisfactory opacification of the pulmonary arteries to the segmental level. No evidence of pulmonary embolism. Normal heart size. No pericardial effusion. Mediastinum/Nodes: No enlarged mediastinal, hilar, or axillary lymph nodes. Thyroid gland, trachea, and esophagus demonstrate no significant findings. Lungs/Pleura: There is atelectasis of the lung bases. No focal pneumonia or pleural effusion is identified. Musculoskeletal: There is scoliosis of spine. No focal acute abnormalities noted. Review of the MIP images confirms the above findings. CT ABDOMEN and PELVIS FINDINGS Hepatobiliary: No focal liver abnormality is seen. No gallstones, gallbladder wall thickening, or biliary dilatation. Pancreas: Unremarkable. No pancreatic ductal dilatation  or surrounding inflammatory changes. Spleen: Normal in size without focal abnormality. Adrenals/Urinary Tract: The adrenal glands are normal. Tiny cyst is identified in the upper pole right kidney. No left renal lesion is identified. There is no hydronephrosis or nephrolithiasis bilaterally. The bladder is normal. Stomach/Bowel: Extensive free air is identified in the abdomen and pelvis more prominently in the abdomen. This consistent with perforated bowel loop. There is stranding and inflammation surrounding thickened small bowel loops in the lower mid abdomen with marked dilatation of small bowel loops proximal to this area of inflammation. The ileal small bowel loops are normal in caliber but demonstrate abnormal diffuse thickened bowel wall. There is diverticulosis of the colon without definite evidence of diverticulitis. The appendix is normal. Vascular/Lymphatic: Aortic atherosclerosis. No enlarged abdominal or pelvic lymph nodes. Reproductive: Prostate gland is enlarged measuring 6.2 cm in diameter. Other: Minimal umbilical herniation of mesenteric fat is identified. Small amount of free fluid is identified in the pelvis. Musculoskeletal: Degenerative joint changes of the spine are noted. Review of the MIP images confirms the above findings. IMPRESSION: No pulmonary embolus.  Atelectasis of lung bases. Extensive free air identified in the abdomen pelvis consistent with perforated bowel loop. There is stranding and inflammation surrounding thickened small bowel loops in the lower abdomen with marked dilatation of small bowel loops proximal to this area of inflammation suggesting this may the center of the inflammation and possible site of perforation. Critical Value/emergent results were called by telephone at the time of interpretation on 03/23/2018 at 10:01 am to Dr. Arthor CaptainABIGAIL HARRIS , who verbally acknowledged these results. Electronically Signed   By: Sherian ReinWei-Chen  Lin M.D.   On: 03/23/2018 10:02   Dg Abd  Acute  W/chest  Addendum Date: 03/23/2018   ADDENDUM REPORT: 03/23/2018 08:58 ADDENDUM: On further review, there is felt to be a degree of pneumoperitoneum. Critical Value/emergent results were called by telephone at the time of interpretation on 03/23/2018 at 8:58 am to Dr. Arthor CaptainABIGAIL HARRIS , who verbally acknowledged these results. Electronically Signed   By: Bretta BangWilliam  Woodruff III M.D.   On: 03/23/2018 08:58   Result Date: 03/23/2018 CLINICAL DATA:  Abdominal cramping with nausea and vomiting EXAM: DG ABDOMEN ACUTE W/ 1V CHEST COMPARISON:  None. FINDINGS: PA chest: There is bibasilar atelectasis. Lungs elsewhere clear. Heart size and pulmonary vascularity are normal. No adenopathy. Supine and upright abdomen: There are multiple loops of dilated small bowel with multiple air-fluid levels. Air is noted in the sigmoid colon and rectum. No evident free air. Vascular calcifications are noted in the pelvis. IMPRESSION: Bowel gas pattern raises concern for a degree of bowel obstruction. Specifically, there are multiple loops of dilated small bowel with air-fluid levels throughout the abdomen. Note that there is a degree of air in colon and rectum. No free air. Bibasilar atelectasis.  Lungs elsewhere clear. Electronically Signed: By: Bretta BangWilliam  Woodruff III M.D. On: 03/23/2018 07:46    Anti-infectives: Anti-infectives (From admission, onward)   Start     Dose/Rate Route Frequency Ordered Stop   03/23/18 1700  piperacillin-tazobactam (ZOSYN) IVPB 3.375 g     3.375 g 12.5 mL/hr over 240 Minutes Intravenous Every 8 hours 03/23/18 1603     03/23/18 1045  cefoTEtan (CEFOTAN) 2 g in sodium chloride 0.9 % 100 mL IVPB     2 g 200 mL/hr over 30 Minutes Intravenous On call to O.R. 03/23/18 1036 03/23/18 1411   03/23/18 0715  piperacillin-tazobactam (ZOSYN) IVPB 3.375 g     3.375 g 100 mL/hr over 30 Minutes Intravenous  Once 03/23/18 0709 03/23/18 0814   03/23/18 0715  vancomycin (VANCOCIN) IVPB 1000 mg/200 mL premix      1,000 mg 200 mL/hr over 60 Minutes Intravenous  Once 03/23/18 0709 03/23/18 [id number]       Assessment/Plan Chronic back pain - disc disease and spinal stenosis per Dr. Dutch QuintPoole  Sepsis secondary to Perforated sigmoid diverticulitis S/p ex lap and Hartmann's 03/23/18 Dr. Gerrit FriendsGerkin - POD#1 - WBC 7.5, afebrile - H/H 12.3/37.6, continue to monitor - NGT with little out - will try clamping some today, pt may continue ice chips - continue dressing changes BID - may be able to d/c foley, will discuss with MD - continue to mobilize  FEN: NPO, IVF; NGT clamping VTE: SCDs, lovenox ID: cefotetan 8/12; zosyn 8/12>> Foley: present  Follow up: Dr. Gerrit FriendsGerkin   LOS: 1 day    Wells GuilesKelly Rayburn , [facility] 03/24/2018, 9:47 AM Pager: [phone] Consults: [phone] Mon-Fri 7:00 am-4:30 pm Sat-Sun 7:00 am-11:30 am

## 2018-03-25 MED ORDER — LIP MEDEX EX OINT
TOPICAL_OINTMENT | CUTANEOUS | Status: AC
  Administered 2018-03-25: 16:00:00
  Filled 2018-03-25: qty 7

## 2018-03-25 NOTE — Consult Note (Addendum)
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  Discussed pouching routines and ordering supplies.  Pt and wife were able to open and close to empty.  Supplies and educational materials left at the bedside.   Enrolled patient in [employer] program: Yes Will perform another teaching session Fri at 10:00 as requested. Cammie Mcgeeawn Alain Deschene MSN, RN, CWOCN, [place]AP, CNS [phone]

## 2018-03-25 NOTE — Progress Notes (Signed)
Pt still has 16.525ml in PCA, 0 ml used in 24hrs. Pt has not been using PCA and states he would liked to be disconnected from it. Clinical research associateWriter passed message along to AK Steel Holding CorporationHannah, Charity fundraiserN.

## 2018-03-25 NOTE — Progress Notes (Signed)
Assessment & Plan: Sepsis secondary to Perforated sigmoid diverticulitis S/p ex lap and Hartmann's 03/23/18 Dr. Gerrit FriendsGerkin - POD#2 - NGT with little out - discontinue this AM - continue ice, sips otherwise NPO - continue dressing changes BID - continue to mobilize, ambulate in halls - WOC instruction for stoma care begun today  FEN: NPO, IVF VTE: SCDs, lovenox ID: cefotetan 8/12; zosyn 8/12>> Follow up: Dr. Gwenith DailyGerkin        Elvis Laufer M. Lorne Winkels, MD, [facility]FACS       [facility]       Office: [phone]   Chief Complaint: Perforated sigmoid diverticulitis  Subjective: Patient doing well, no complaints.  Wife at bedside.  Doesn't like NG tube.  Voiding.  Objective: Vital signs in last 24 hours: Temp:  [97.8 F (36.6 C)-98.8 F (37.1 C)] 97.8 F (36.6 C) (08/14 0918) Pulse Rate:  [66-71] 70 (08/14 0918) Resp:  [16-26] 18 (08/14 0918) BP: (108-159)/(68-77) 145/77 (08/14 0918) SpO2:  [96 %-100 %] 96 % (08/14 0918) FiO2 (%):  [60 %-72 %] 60 % (08/14 0758) Last BM Date: 03/21/18  Intake/Output from previous day: 08/13 0701 - 08/14 0700 In: 2159.2 [I.V.:1816.5; IV Piggyback:342.7] Out: 1735 [Urine:1375; Emesis/NG output:360] Intake/Output this shift: Total I/O In: 181.5 [I.V.:144; IV Piggyback:37.5] Out: 250 [Urine:200; Emesis/NG output:50]  Physical Exam: HEENT - sclerae clear, mucous membranes moist Neck - soft Chest - clear bilaterally Cor - RRR Abdomen - softer, less distended; ostomy viable, no output; midline dressing dry and intact Ext - no edema, non-tender Neuro - alert & oriented, no focal deficits  Lab Results:  Recent Labs    03/23/18 0618 03/23/18 0713 03/24/18 0425  WBC 17.2*  --  7.5  HGB 16.2 16.3 12.3*  HCT 48.0 48.0 37.6*  PLT 253  --  203   BMET Recent Labs    03/23/18 0618 03/23/18 0713 03/24/18 0425  NA 139 134* 138  K 4.4 3.9 4.2  CL 98 98 103  CO2 24  --  27  GLUCOSE 163* 160* 147*  BUN 26* 28* 19  CREATININE  1.44* 1.20 1.01  CALCIUM 10.0  --  8.5*   PT/INR No results for input(s): LABPROT, INR in the last 72 hours. Comprehensive Metabolic Panel:    Component Value Date/Time   NA 138 03/24/2018 0425   NA 134 (L) 03/23/2018 0713   K 4.2 03/24/2018 0425   K 3.9 03/23/2018 0713   CL 103 03/24/2018 0425   CL 98 03/23/2018 0713   CO2 27 03/24/2018 0425   CO2 24 03/23/2018 0618   BUN 19 03/24/2018 0425   BUN 28 (H) 03/23/2018 0713   CREATININE 1.01 03/24/2018 0425   CREATININE 1.20 03/23/2018 0713   GLUCOSE 147 (H) 03/24/2018 0425   GLUCOSE 160 (H) 03/23/2018 0713   CALCIUM 8.5 (L) 03/24/2018 0425   CALCIUM 10.0 03/23/2018 0618   AST 19 03/24/2018 0425   AST 42 (H) 03/23/2018 0618   ALT 26 03/24/2018 0425   ALT 48 (H) 03/23/2018 0618   ALKPHOS 57 03/24/2018 0425   ALKPHOS 93 03/23/2018 0618   BILITOT 0.9 03/24/2018 0425   BILITOT 1.2 03/23/2018 0618   PROT 5.7 (L) 03/24/2018 0425   PROT 8.0 03/23/2018 0618   ALBUMIN 2.6 (L) 03/24/2018 0425   ALBUMIN 3.9 03/23/2018 0618    Studies/Results: X-ray Abdomen Ap  Result Date: 03/23/2018 CLINICAL DATA:  NG tube placement. Status post abdominal surgery today. EXAM: ABDOMEN - 1 VIEW COMPARISON:  03/23/2018 CT FINDINGS: An NG tube is identified with tip overlying the proximal-mid stomach. Dilated small bowel loops are again noted. Bibasilar atelectasis is identified. No other significant changes noted. IMPRESSION: NG tube with tip overlying the proximal-mid stomach. Dilated small bowel loops again noted. Electronically Signed   By: Harmon PierJeffrey  Hu M.D.   On: 03/23/2018 16:34      Patrese Neal M 03/25/2018  Patient ID: Glenn Duncan, male   DOB: [birth year], 67 y.o.   MRN: [id number]

## 2018-03-26 MED ORDER — TRAMADOL HCL 50 MG PO TABS: 50.0000 mg | ORAL_TABLET | Freq: Four times a day (QID) | ORAL | Status: DC | PRN

## 2018-03-26 MED ORDER — METHOCARBAMOL 500 MG PO TABS
500.0000 mg | ORAL_TABLET | Freq: Three times a day (TID) | ORAL | Status: DC
  Administered 2018-03-26 – 2018-03-27 (×5): 500 mg via ORAL
  Filled 2018-03-26 (×5): qty 1

## 2018-03-26 MED ORDER — ACETAMINOPHEN 325 MG PO TABS
650.0000 mg | ORAL_TABLET | Freq: Four times a day (QID) | ORAL | Status: DC | PRN
  Administered 2018-03-26: 650 mg via ORAL
  Filled 2018-03-26: qty 2

## 2018-03-26 MED ORDER — MORPHINE SULFATE (PF) 2 MG/ML IV SOLN: 1.0000 mg | INTRAVENOUS | Status: DC | PRN

## 2018-03-26 MED ORDER — OXYCODONE HCL 5 MG PO TABS: 5.0000 mg | ORAL_TABLET | ORAL | Status: DC | PRN

## 2018-03-26 NOTE — Progress Notes (Signed)
[facility] Progress Note  3 Days Post-Op  Subjective: CC: feel tired Pt didn't get much sleep overnight. Denies abdominal pain. Denies nausea. Having some stool output via stoma this AM.   Objective: Vital signs in last 24 hours: Temp:  [98 F (36.7 C)-99.4 F (37.4 C)] 98.2 F (36.8 C) (08/15 0447) Pulse Rate:  [51-65] 51 (08/15 0447) Resp:  [14-29] 22 (08/15 0922) BP: (130-151)/(66-82) 130/66 (08/15 0447) SpO2:  [97 %-100 %] 98 % (08/15 0447) FiO2 (%):  [71 %] 71 % (08/14 1500) Last BM Date: 03/26/18  Intake/Output from previous day: 08/14 0701 - 08/15 0700 In: 1282.6 [I.V.:922; IV Piggyback:360.6] Out: 900 [Urine:850; Emesis/NG output:50] Intake/Output this shift: Total I/O In: 0  Out: 200 [Urine:200]  PE: Gen:  Alert, NAD, pleasant Card:  Regular rate and rhythm, pedal pulses 2+ BL Pulm:  Normal effort, clear to auscultation bilaterally Abd: Soft, non-tender, non-distended, bowel sounds present, incision clean with good granulation tissue, stoma pink with liquid stool in bag Skin: warm and dry, no rashes  Psych: A&Ox3   Lab Results:  Recent Labs    03/24/18 0425  WBC 7.5  HGB 12.3*  HCT 37.6*  PLT 203   BMET Recent Labs    03/24/18 0425  NA 138  K 4.2  CL 103  CO2 27  GLUCOSE 147*  BUN 19  CREATININE 1.01  CALCIUM 8.5*   PT/INR No results for input(s): LABPROT, INR in the last 72 hours. CMP     Component Value Date/Time   NA 138 03/24/2018 0425   K 4.2 03/24/2018 0425   CL 103 03/24/2018 0425   CO2 27 03/24/2018 0425   GLUCOSE 147 (H) 03/24/2018 0425   BUN 19 03/24/2018 0425   CREATININE 1.01 03/24/2018 0425   CALCIUM 8.5 (L) 03/24/2018 0425   PROT 5.7 (L) 03/24/2018 0425   ALBUMIN 2.6 (L) 03/24/2018 0425   AST 19 03/24/2018 0425   ALT 26 03/24/2018 0425   ALKPHOS 57 03/24/2018 0425   BILITOT 0.9 03/24/2018 0425   GFRNONAA >60 03/24/2018 0425   GFRAA >60 03/24/2018 0425   Lipase     Component Value Date/Time   LIPASE 22 03/23/2018 0618       Studies/Results: No results found.  Anti-infectives: Anti-infectives (From admission, onward)   Start     Dose/Rate Route Frequency Ordered Stop   03/23/18 1700  piperacillin-tazobactam (ZOSYN) IVPB 3.375 g     3.375 g 12.5 mL/hr over 240 Minutes Intravenous Every 8 hours 03/23/18 1603     03/23/18 1045  cefoTEtan (CEFOTAN) 2 g in sodium chloride 0.9 % 100 mL IVPB     2 g 200 mL/hr over 30 Minutes Intravenous On call to O.R. 03/23/18 1036 03/23/18 1411   03/23/18 0715  piperacillin-tazobactam (ZOSYN) IVPB 3.375 g     3.375 g 100 mL/hr over 30 Minutes Intravenous  Once 03/23/18 0709 03/23/18 0814   03/23/18 0715  vancomycin (VANCOCIN) IVPB 1000 mg/200 mL premix     1,000 mg 200 mL/hr over 60 Minutes Intravenous  Once 03/23/18 0709 03/23/18 [id number]       Assessment/Plan Chronic back pain - disc disease and spinal stenosis per Dr. Dutch QuintPoole  Sepsis secondary to Perforated sigmoid diverticulitis S/p ex lap and Hartmann's 03/23/18 Dr. Gerrit FriendsGerkin - POD#3 - NGT removed yesterday, stool output this AM - advance to CLD and FLD this evening if doing well - continue dressing changes BID -continue to mobilize, ambulate in halls - WOC instruction for  stoma care  - repeat labs in AM - likely stop Zosyn tomorrow if WBC normal - d/c PCA and start transitioning to PO pain control  FEN: CLD, decrease IVF VTE: SCDs, lovenox ID: cefotetan 8/12; zosyn 8/12>> Follow up: Dr. Gerrit FriendsGerkin  LOS: 3 days    Wells GuilesKelly Rayburn , [facility] 03/26/2018, 10:23 AM Pager: [phone] Consults: [phone] Mon-Fri 7:00 am-4:30 pm Sat-Sun 7:00 am-11:30 am

## 2018-03-26 NOTE — Care Management Important Message (Signed)
Important Message  Patient Details  Name: Glenn Duncan MRN: [id number] Date of Birth: [birth year]   Medicare Important Message Given:  Yes    Caren MacadamFuller, Jobin Montelongo 03/26/2018, 10:20 AMImportant Message  Patient Details  Name: Glenn Beerslbert Dejarnett MRN: [id number] Date of Birth: [birth year]   Medicare Important Message Given:  Yes    Caren MacadamFuller, Rykar Lebleu 03/26/2018, 10:20 AM

## 2018-03-26 NOTE — Care Management Note (Signed)
Case Management Note  Patient Details  Name: Glenn Duncan MRN: [id number] Date of Birth: 03-16-1951  Subjective/Objective:      Spoke with patient and spouse at bedside. [facility]. They are agreeable. [facility]. They are new to the area and discussed obtaining a PCP. They have a doctor they have seen recently and plan to establish with them.                Action/Plan: Contacted [facility] for referral, they have accepted.  Expected Discharge Date:  (unknown)               Expected Discharge Plan:  Home w Home Health Services  In-House Referral:  NA  Discharge planning Services  CM Consult  Post Acute Care Choice:  Home Health Choice offered to:  Patient  DME Arranged:  N/A DME Agency:  NA  HH Arranged:  RN HH Agency:  [facility]  Status of Service:  Completed, signed off  If discussed at Long Length of Stay Meetings, dates discussed:    Additional Comments:  Alexis Goodelleele, Jaydin Boniface K, RN 03/26/2018, 2:52 PM

## 2018-03-26 NOTE — Progress Notes (Signed)
30 Day Unplanned Readmission Risk Score     ED to Hosp-Admission (Current) from 03/23/2018 in [facility]  30 Day Unplanned Readmission Risk Score (%)  8 Filed at 03/26/2018 1200     This score is the patient's risk of an unplanned readmission within 30 days of being discharged (0 -100%). The score is based on dignosis, age, lab data, medications, orders, and past utilization.   Low:  0-14.9   Medium: 15-21.9   High: 22-29.9   Extreme: 30 and above        Readmission Risk Prevention Plan 03/26/2018  Post Dischage Appt Complete  Medication Screening Complete  Transportation Screening Complete  PCP follow-up Complete

## 2018-03-26 NOTE — Plan of Care (Signed)
Nutrition Education Note  RD consulted for nutrition education regarding a low fiber diet for diverticulitis. Pt to follow low fiber diet for 6 weeks.  RD provided "Fiber Restricted Nutrition therapy" and "High Fiber Nutrition Therapy" handouts with supporting information. Reviewed patient's dietary recall. Provided examples of low and high fiber foods. Discouraged intake of high fiber foods, high fat foods, spicy foods, processed foods, caffeine and red meats when having a flare. Encouraged pt to cook foods until they are soft and chew foods well to help aid in digestion. Also recommend frequent small meals. Encouraged use of a multi-vitamin and protein supplements while having a flare.    RD encouraged gradual increases in fiber once able to advance diet. Encouraged pt to increase fluid consumption when increasing fiber intake.  Expect good compliance. Pt's wife present for education.  Body mass index is 25.85 kg/m. Pt meets criteria for normal based on current BMI.  Current diet order is full liquids, patient is consuming approximately 50% of meals at this time. Labs and medications reviewed. No further nutrition interventions warranted at this time. If additional nutrition issues arise, please re-consult RD.  Glenn [name] [name] [name], MS, RD, LDN Glenn Duncan Inpatient Clinical Dietitian Pager: [phone] After Hours Pager: [phone]

## 2018-03-27 LAB — BASIC METABOLIC PANEL
ANION GAP: 8 (ref 5–15)
BUN: 14 mg/dL (ref 8–23)
CO2: 25 mmol/L (ref 22–32)
Calcium: 8.5 mg/dL — ABNORMAL LOW (ref 8.9–10.3)
Chloride: 106 mmol/L (ref 98–111)
Creatinine, Ser: 0.84 mg/dL (ref 0.61–1.24)
Glucose, Bld: 100 mg/dL — ABNORMAL HIGH (ref 70–99)
POTASSIUM: 3.4 mmol/L — AB (ref 3.5–5.1)
SODIUM: 139 mmol/L (ref 135–145)

## 2018-03-27 LAB — CBC
HCT: 34.2 % — ABNORMAL LOW (ref 39.0–52.0)
Hemoglobin: 11.3 g/dL — ABNORMAL LOW (ref 13.0–17.0)
MCH: 26.7 pg (ref 26.0–34.0)
MCHC: 33 g/dL (ref 30.0–36.0)
MCV: 80.7 fL (ref 78.0–100.0)
PLATELETS: 282 10*3/uL (ref 150–400)
RBC: 4.24 MIL/uL (ref 4.22–5.81)
RDW: 14.1 % (ref 11.5–15.5)
WBC: 9.1 10*3/uL (ref 4.0–10.5)

## 2018-03-27 MED ORDER — AMOXICILLIN-POT CLAVULANATE 875-125 MG PO TABS: 1.0000 | ORAL_TABLET | Freq: Two times a day (BID) | ORAL | 0 refills | Status: AC

## 2018-03-27 MED ORDER — AMOXICILLIN-POT CLAVULANATE 875-125 MG PO TABS
1.0000 | ORAL_TABLET | Freq: Two times a day (BID) | ORAL | Status: DC
  Administered 2018-03-27: 1 via ORAL
  Filled 2018-03-27: qty 1

## 2018-03-27 MED ORDER — ACETAMINOPHEN 325 MG PO TABS: 650.0000 mg | ORAL_TABLET | Freq: Three times a day (TID) | ORAL | Status: DC | PRN

## 2018-03-27 MED ORDER — ACETAMINOPHEN 325 MG PO TABS
650.0000 mg | ORAL_TABLET | Freq: Four times a day (QID) | ORAL | Status: DC
  Administered 2018-03-27 (×2): 650 mg via ORAL
  Filled 2018-03-27 (×2): qty 2

## 2018-03-27 MED ORDER — ACETAMINOPHEN 325 MG PO TABS: 650.0000 mg | ORAL_TABLET | Freq: Four times a day (QID) | ORAL | Status: DC | PRN

## 2018-03-27 MED ORDER — FAMOTIDINE 20 MG PO TABS
20.0000 mg | ORAL_TABLET | Freq: Every day | ORAL | Status: DC
  Administered 2018-03-27: 20 mg via ORAL
  Filled 2018-03-27: qty 1

## 2018-03-27 MED ORDER — METHOCARBAMOL 500 MG PO TABS: 500.0000 mg | ORAL_TABLET | Freq: Three times a day (TID) | ORAL | 0 refills | Status: DC | PRN

## 2018-03-27 NOTE — Progress Notes (Signed)
Pt alert, oriented, tolerated diet with no complaints, eager to go home.  D/C instructions were given, as well as prescription.  Pt was d/cd home with spouse.

## 2018-03-27 NOTE — Consult Note (Addendum)
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 with barrier ring Education provided:  Performed pouch change with patient and wife assistance. Reviewed pouching routines and ordering supplies. Pt has been emptying independently. Supplies and educational materials left at the bedside. They deny further questions. [facility]: Yes Cammie Mcgeeawn Chevy Sweigert MSN, RN, [place], [place], [place] [phone]

## 2018-03-27 NOTE — Discharge Instructions (Signed)
[facility]      [facility] °[phone] ° °OPEN ABDOMINAL SURGERY: POST OP INSTRUCTIONS ° °Always review your discharge instruction sheet given to you by the facility where your surgery was performed. ° °IF YOU HAVE DISABILITY OR FAMILY LEAVE FORMS, YOU MUST BRING THEM TO THE OFFICE FOR PROCESSING.  PLEASE DO NOT GIVE THEM TO YOUR DOCTOR. ° °1. A prescription for pain medication may be given to you upon discharge.  Take your pain medication as prescribed, if needed.  If narcotic pain medicine is not needed, then you may take acetaminophen (Tylenol) or ibuprofen (Advil) as needed. °2. Take your usually prescribed medications unless otherwise directed. °3. If you need a refill on your pain medication, please contact your pharmacy. They will contact our office to request authorization.  Prescriptions will not be filled after 5pm or on week-ends. °4. You should follow a light diet the first few days after arrival home, such as soup and crackers, pudding, etc.unless your doctor has advised otherwise. A high-fiber, low fat diet can be resumed as tolerated.   Be sure to include lots of fluids daily. Most patients will experience some swelling and bruising on the chest and neck area.  Ice packs will help.  Swelling and bruising can take several days to resolve °5. Most patients will experience some swelling and bruising in the area of the incision. Ice pack will help. Swelling and bruising can take several days to resolve..  °6. It is common to experience some constipation if taking pain medication after surgery.  Increasing fluid intake and taking a stool softener will usually help or prevent this problem from occurring.  A mild laxative (Milk of Magnesia or Miralax) should be taken according to package directions if there are no bowel movements after 48 hours. °7.  You may have steri-strips (small skin tapes) in place directly over the incision.  These strips should be left on the skin for 7-10 days.  If your  surgeon used skin glue on the incision, you may shower in 24 hours.  The glue will flake off over the next 2-3 weeks.  Any sutures or staples will be removed at the office during your follow-up visit. You may find that a light gauze bandage over your incision may keep your staples from being rubbed or pulled. You may shower and replace the bandage daily. °8. ACTIVITIES:  You may resume regular (light) daily activities beginning the next day--such as daily self-care, walking, climbing stairs--gradually increasing activities as tolerated.  You may have sexual intercourse when it is comfortable.  Refrain from any heavy lifting or straining until approved by your doctor. °a. You may drive when you no longer are taking prescription pain medication, you can comfortably wear a seatbelt, and you can safely maneuver your car and apply brakes °b. Return to Work: ___________________________________ °9. You should see your doctor in the office for a follow-up appointment approximately two weeks after your surgery.  Make sure that you call for this appointment within a day or two after you arrive home to insure a convenient appointment time. °OTHER INSTRUCTIONS:  °_____________________________________________________________ °_____________________________________________________________ ° °WHEN TO CALL YOUR DOCTOR: °1. Fever over 101.0 °2. Inability to urinate °3. Nausea and/or vomiting °4. Extreme swelling or bruising °5. Continued bleeding from incision. °6. Increased pain, redness, or drainage from the incision. °7. Difficulty swallowing or breathing °8. Muscle cramping or spasms. °9. Numbness or tingling in hands or feet or around lips. ° °The clinic staff is available to   answer your questions during regular business hours.  Please don’t hesitate to call and ask to speak to one of the nurses if you have concerns. ° °For further questions, please visit [email or web address] ° ° °Colostomy, Adult, Care After °Refer to  this sheet in the next few weeks. These instructions provide you with information about caring for yourself after your procedure. Your health care provider may also give you more specific instructions. Your treatment has been planned according to current medical practices, but problems sometimes occur. Call your health care provider if you have any problems or questions after your procedure. °What can I expect after the procedure? °After the procedure, it is common to have: °· Swelling at the opening that was created during the procedure (stoma). °· Slight bleeding around the stoma. °· Redness around the stoma. ° °Follow these instructions at home: °Activity °· Rest as needed while the stoma area heals. °· Return to your normal activities as told by your health care provider. Ask your health care provider what activities are safe for you. °· Avoid strenuous activity and abdominal exercises for 3 weeks or for as long as told by your health care provider. °· Do not lift anything that is heavier than 10 lb (4.5 kg). °Incision care ° °· Follow instructions from your health care provider about how to take care of your incision. Make sure you: °? Wash your hands with soap and water before you change your bandage (dressing). If soap and water are not available, use hand sanitizer. °? Change your dressing as told by your health care provider. °? Leave stitches (sutures), skin glue, or adhesive strips in place. These skin closures may need to stay in place for 2 weeks or longer. If adhesive strip edges start to loosen and curl up, you may trim the loose edges. Do not remove adhesive strips completely unless your health care provider tells you to do that. °Stoma Care °· Keep the stoma area clean. °· Clean and dry the skin around the stoma each time you change the colostomy bag. To clean the stoma area: °? Use warm water and only use cleansers that are recommended by your health care provider. °? Rinse the stoma area with  plain water. °? Dry the area well. °· Use stoma powder or ointment on your skin only as told by your health care provider. Do not use any other powders, gels, wipes, or creams on your skin. °· Check the stoma area every day for signs of infection. Check for: °? More redness, swelling, or pain. °? More fluid or blood. °? Pus or warmth. °· Measure the stoma opening regularly and record the size. Watch for changes. Share this information with your health care provider. °Bathing °· Do not take baths, swim, or use a hot tub until your health care provider approves. Ask your health care provider if you can take showers. You may be able to shower with or without the colostomy bag in place. If you bathe with the bag on, dry the bag afterward. °· Avoid using harsh or oily soaps when you bathe. °Colostomy Bag Care °· Follow instructions from your health care provider about how to empty or change the colostomy bag. °· Keep colostomy supplies with you at all times. °· Store all supplies in a cool, dry place. °· Empty the colostomy bag: °? Whenever it is one-third to one-half full. °? At bedtime. °· Replace the bag every 2-4 days or as told by your health care provider. °Driving °·   Do not drive for 24 hours if you received a sedative.  Do not drive or operate heavy machinery while taking prescription pain medicine. General instructions  Follow instructions from your health care provider about eating or drinking restrictions.  Take over-the-counter and prescription medicines only as told by your health care provider.  Avoid wearing clothes that are tight directly over your stoma.  Do not use any tobacco products, such as cigarettes, chewing tobacco, and e-cigarettes. If you need help quitting, ask your health care provider.  (Women) Ask your health care provider about becoming pregnant and about using birth control. Medicines may not be absorbed normally after the procedure.  Keep all follow-up visits as told by  your health care provider. This is important. Contact a health care provider if:  You are having trouble caring for your stoma or changing the colostomy bag.  You feel nauseous or you vomit.  You have a fever.  You havemore redness, swelling, or pain at the site of your stoma or around your anus.  You have more fluid or blood coming from your stoma or your anus.  Your stoma area feels warm to the touch.  You have pus coming from your stoma.  You notice a change in the size or appearance of the stoma.  You have abdominal pain, bloating, pressure, or cramping.  Your have stool more often or less often than your health care provider tells you to expect.  You are not making much urine. This may be a sign of dehydration. Get help right away if:  Your abdominal pain does not go away or it becomes severe.  You keep vomiting.  Your stool is not draining through the stoma.  You have chest pain or an irregular heartbeat. This information is not intended to replace advice given to you by your health care provider. Make sure you discuss any questions you have with your health care provider. Document Released: 12/19/2010 Document Revised: 12/07/2015 Document Reviewed: 04/11/2015 Elsevier Interactive Patient Education  2018 [employer].   Colostomy Home Guide, Adult A colostomy is a surgical procedure to make an opening (stoma) for stool (feces) to leave your body. This surgery is done when a medical condition prevents stool from leaving your body through the end of the large intestine (rectum). During the surgery, part of the large intestine (colon) is attached to the stoma that is made in the front of your abdomen. A bag (pouch) is fitted over the stoma. Stool and gas will collect in the bag. After having this surgery, you will need to empty and change your colostomy bag as needed. You will also need to care for the stoma. How do I care for my stoma? Your stoma should look pink,  red, and moist, like the inside of your cheek. At first, the stoma may be swollen, but this swelling will go away within 6 weeks. To care for the stoma:  Keep the skin around the stoma clean and dry.  Use a clean, soft washcloth to gently wash the stoma and the skin around it. ? Use warm water and only use cleansers recommended by your health care provider. ? Rinse the stoma area with plain water. ? Dry the area well.  Use stoma powder or ointment on your skin only as told by your health care provider. Do not use any other powders, gels, wipes, or creams on your skin.  Change your colostomy bag if your skin becomes irritated. Irritation may indicate that the bag is  leaking.  Check your stoma area every day for signs of infection. Check for: ? More redness, swelling, or pain. ? More fluid or blood. ? Pus or warmth.  Measure the stoma opening regularly and record the size. Watch for changes. Share this information with your health care provider.  How do I care for my colostomy bag? The bag that fits over the stoma can have either one or two pieces.  One-piece bag: The skin barrier and the bag are combined in a single unit.  Two-piece bag: The skin barrier and the bag are separate pieces that attach to each other.  Empty your bag at bedtime and whenever it is one-third to one-half full. Do not let more stool or gas build up. This could cause the bag to leak. Some colostomy bags have a built-in gas release valve. Change the bag every 3-4 days or as told by your health care provider. Also change the bag if it is leaking or separating from the skin or your skin looks irritated. How do I empty my colostomy bag? Before you leave the hospital, you will be taught how to empty your bag. Follow these basic steps: 1. Wash your hands with soap and water. 2. Sit far back on the toilet. 3. Put several pieces of toilet paper into the toilet water. This will prevent splashing as you empty the stool  into the toilet. 4. Remove the clip or the velcro from the tail end of the bag. 5. Unroll the tail, then empty stool into the toilet. 6. Clean the tail with toilet paper. 7. Reroll the tail, and close it with the clip or velcro. 8. Wash your hands again.  How do I change my colostomy bag? Before you leave the hospital, you will be taught how to change your bag. Always have colostomy supplies with you, and follow these basic steps: 1. Wash your hands with soap and water. Have paper towels or tissues near you to clean any discharge. 2. Use a template to pre-cut the skin barrier. Smooth any rough edges. 3. If using a two-piece bag, attach the bag and the skin barrier to each other. Add the barrier ring, if you use one. 4. If your stools are watery, add a few cotton balls to the new bag to absorb the liquid. 5. Remove the old bag and skin barrier. Gently push the skin away from the barrier with your fingers or a warm cloth. 6. Wash your hands again. Then clean the stoma area as directed with water or with mild soap and water. Use water to rinse away any soap. 7. Dry the skin. You may use the cool setting on a hair dryer to do this. 8. If directed, apply stoma powder or skin barrier gel to the skin. 9. Dry the skin again. 10. Warm the skin barrier with your hands or a warm compress. 11. Remove the paper from the sticky (adhesive) strip of the skin barrier. 12. Press the adhesive strip onto the skin around the stoma. 13. Gently rub the skin barrier onto the skin. This creates heat that helps the barrier to stick. 14. Apply stoma tape to the edges of the skin barrier.  What are some general tips?  Avoid wearing clothes that are tight directly over your stoma.  You may shower or bathe with the colostomy bag on or off. Do not use harsh or oily soaps or lotions. Dry the skin and bag after bathing.  Store all supplies in a cool, dry  place. Do not leave supplies in extreme heat because parts can  melt.  Whenever you leave home, take an extra skin barrier and colostomy bag with you.  If your colostomy bag gets wet, you can dry it with a hair dryer on the cool setting.  To prevent odor, put drops of ostomy deodorizer in the colostomy bag. Your health care provider may also recommend putting ostomy lubricant inside the bag. This helps the stool to slide out of the bag more easily and completely. Contact a health care provider if:  You have more redness, swelling, or pain around your stoma.  You have more fluid or blood coming from your stoma.  Your stoma feels warm to the touch.  You have pus coming from your stoma.  Your stoma extends in or out farther than normal.  You need to change the bag every day.  You have a fever. Get help right away if:  Your stool is bloody.  You vomit.  You have trouble breathing. This information is not intended to replace advice given to you by your health care provider. Make sure you discuss any questions you have with your health care provider. Document Released: 08/01/2003 Document Revised: 12/07/2015 Document Reviewed: 12/01/2013 Elsevier Interactive Patient Education  2018 Elsevier Inc.   Mechanical Wound Debridement Mechanical wound debridement is a treatment to remove dead tissue from a wound. This helps the wound heal. The treatment involves cleaning the wound (irrigation) and using a pad or gauze (dressing) to remove dead tissue and debris from the wound. There are different types of mechanical wound debridement. Depending on the wound, you may need to repeat this procedure or change to another form of debridement as your wound starts to heal. Tell a health care provider about:  Any allergies you have.  All medicines you are taking, including vitamins, herbs, eye drops, creams, and over-the-counter medicines.  Any blood disorders you have.  Any medical conditions you have, including any conditions that: ? Cause a significant  decrease in blood circulation to the part of the body where the wound is, such as peripheral vascular disease. ? Compromise your defense (immune) system or white blood count.  Any surgeries you have had.  Whether you are pregnant or may be pregnant. What are the risks? Generally, this is a safe procedure. However, problems may occur, including:  Infection.  Bleeding.  Damage to healthy tissue in and around your wound.  Soreness or pain.  Failure of the wound to heal.  Scarring.  What happens before the procedure? You may be given antibiotic medicine to help prevent infection. What happens during the procedure?  Your health care provider may apply a numbing medicine (topical anesthetic) to the wound.  Your health care provider will irrigate your wound with a germ-free (sterile), salt-water (saline) solution. This removes debris, bacteria, and dead tissue.  Depending on what type of mechanical wound debridement you are having, your health care provider may do one of the following: ? Put a dressing on your wound. You may have dry gauze pad placed into the wound. Your health care provider will remove the gauze after the wound is dry. Any dead tissue and debris that has dried into the gauze will be lifted out of the wound (wet-to-dry debridement). ? Use a type of pad (monofilament fiber debridement pad). This pad has a fluffy surface on one side that picks up dead tissue and debris from your wound. Your health care provider wets the pad and wipes it  over your wound for several minutes. ? Irrigate your wound with a pressurized stream of solution such as saline or water.  Once your health care provider is finished, he or she may apply a light dressing to your wound. The procedure may vary among health care providers and hospitals. What happens after the procedure?  You may receive medicine for pain.  You will continue to receive antibiotic medicine if it was started before your  procedure. This information is not intended to replace advice given to you by your health care provider. Make sure you discuss any questions you have with your health care provider. Document Released: 04/19/2015 Document Revised: 01/04/2016 Document Reviewed: 12/07/2014 Elsevier Interactive Patient Education  [employer]2018 [employer] Inc.

## 2018-03-27 NOTE — Progress Notes (Addendum)
[facility] Progress Note  4 Days Post-Op  Subjective: CC:  Pain improving. Feels SCD's help with his back pain. Tolerating full liquids. Having colostomy output. Denies urinary symptoms. Mobilized in hallway yesterday.   Objective: Vital signs in last 24 hours: Temp:  [97.8 F (36.6 C)-98.8 F (37.1 C)] 98.7 F (37.1 C) (08/16 0446) Pulse Rate:  [57-63] 61 (08/16 0446) Resp:  [18-22] 18 (08/16 0446) BP: (123-156)/(71-79) 156/73 (08/16 0446) SpO2:  [96 %-100 %] 96 % (08/16 0446) Last BM Date: 03/26/18  Intake/Output from previous day: 08/15 0701 - 08/16 0700 In: 2762.4 [P.O.:420; I.V.:2107.1; IV Piggyback:235.2] Out: 1500 [Urine:1325; Stool:175] Intake/Output this shift: No intake/output data recorded.  PE: Gen:  Alert, NAD, pleasant Card:  Regular rate and rhythm, pedal pulses 2+ BL Pulm:  Normal effort, clear to auscultation bilaterally Abd: Soft, appropriately tender, incision clean and dry, ostomy with stool in pouch tissue, stoma pink  Skin: warm and dry, no rashes  Psych: A&Ox3   Lab Results:  Recent Labs    03/27/18 0430  WBC 9.1  HGB 11.3*  HCT 34.2*  PLT 282   BMET Recent Labs    03/27/18 0430  NA 139  K 3.4*  CL 106  CO2 25  GLUCOSE 100*  BUN 14  CREATININE 0.84  CALCIUM 8.5*   CMP     Component Value Date/Time   NA 139 03/27/2018 0430   K 3.4 (L) 03/27/2018 0430   CL 106 03/27/2018 0430   CO2 25 03/27/2018 0430   GLUCOSE 100 (H) 03/27/2018 0430   BUN 14 03/27/2018 0430   CREATININE 0.84 03/27/2018 0430   CALCIUM 8.5 (L) 03/27/2018 0430   PROT 5.7 (L) 03/24/2018 0425   ALBUMIN 2.6 (L) 03/24/2018 0425   AST 19 03/24/2018 0425   ALT 26 03/24/2018 0425   ALKPHOS 57 03/24/2018 0425   BILITOT 0.9 03/24/2018 0425   GFRNONAA >60 03/27/2018 0430   GFRAA >60 03/27/2018 0430   Lipase     Component Value Date/Time   LIPASE 22 03/23/2018 0618       Studies/Results: No results found.  Anti-infectives: Anti-infectives  (From admission, onward)   Start     Dose/Rate Route Frequency Ordered Stop   03/23/18 1700  piperacillin-tazobactam (ZOSYN) IVPB 3.375 g     3.375 g 12.5 mL/hr over 240 Minutes Intravenous Every 8 hours 03/23/18 1603     03/23/18 1045  cefoTEtan (CEFOTAN) 2 g in sodium chloride 0.9 % 100 mL IVPB     2 g 200 mL/hr over 30 Minutes Intravenous On call to O.R. 03/23/18 1036 03/23/18 1411   03/23/18 0715  piperacillin-tazobactam (ZOSYN) IVPB 3.375 g     3.375 g 100 mL/hr over 30 Minutes Intravenous  Once 03/23/18 0709 03/23/18 0814   03/23/18 0715  vancomycin (VANCOCIN) IVPB 1000 mg/200 mL premix     1,000 mg 200 mL/hr over 60 Minutes Intravenous  Once 03/23/18 0709 03/23/18 [id number]     Assessment/Plan Chronic back pain - disc disease and spinal stenosis per Dr. Dutch QuintPoole  Sepsis secondary to Perforated sigmoid diverticulitis S/p ex lap and Hartmann's 03/23/18 Dr. Gerrit FriendsGerkin - POD#4 - NGT removed 8/14 - having gas and stool in colostomy, advance to SOFT diet. - continue dressing changes BID -continue to mobilize, ambulate in halls - WOC instruction for stoma care  - leukocytosis resolved, D/C Zosyn  - PO pain control  FEN: FLD, decrease IVF VTE: SCDs, lovenox ID: cefotetan 8/12; zosyn 8/12-8/16 (5 days) Follow up:  Dr. Gerrit FriendsGerkin  Plan: saline lock IV, SOFT diet, mobilize. Discharge home this afternoon vs tomorrow.  Patient asked that I notify Dr. Jordan LikesPool of his hospitalization, he has an upcoming appointment with him for his radiculopathy. I have sent Dr. Jordan LikesPool an epic message.     LOS: 4 days    Hosie SpangleElizabeth Myria Steenbergen, [facility] Pager: [phone]

## 2018-03-28 LAB — CULTURE, BLOOD (ROUTINE X 2)
CULTURE: NO GROWTH
CULTURE: NO GROWTH
SPECIAL REQUESTS: ADEQUATE
Special Requests: ADEQUATE

## 2018-04-02 NOTE — Discharge Summary (Signed)
[facility] Discharge Summary   Patient ID: Glenn Duncan MRN: [id number] DOB/AGE: 66/22/1952 66 y.o.  Admit date: 03/23/2018 Discharge date: 03/27/2018   Discharge Diagnosis Patient Active Problem List   Diagnosis Date Noted   Perforated sigmoid diverticulitis    . Perforated viscus 03/23/2018    Consultants None  Imaging: CT scan abd/pelvis 03/23/18 Extensive free air identified in the abdomen pelvis consistent with perforated bowel loop. There is stranding and inflammation surrounding thickened small bowel loops in the lower abdomen with marked dilatation of small bowel loops proximal to this area of inflammation suggesting this may the center of the inflammation and possible site of perforation.  Procedures Dr. Darnell Levelodd Gerkin (03/23/18) - exploratory laparotomy, sigmoidectomy, colostomy (hartmanns)  Hospital Course:  67 y/o male with a PMH chronic back pain who [facility] a cc worsening abdominal pain, fever, and back pain over 48 hours. Workup was significant for leukocytosis (17.2) and CT chest/abd pelvis as above with pneumoperitoneum. The patient was started on IV antibiotics and taken emergently for exploratory laparotomy and repair of perforated viscous, found to be perforated diverticulitis. He tolerated the procedure well and was transferred to the floor. NG tube was removed on POD#2. On 03/27/18 (POD#4) the patient was afebrile, pain controlled, having gas/stool in colostomy pouch, tolerating solid oral intake, mobilizing, and stable for discharge. He received new colostomy education in the hospital. He was discharged home with outpatient follow up as below and home health RN for colostomy and wound care.    Allergies as of 03/27/2018   No Known Allergies     Medication List    STOP taking these medications   naproxen 500 MG tablet Commonly known as:  NAPROSYN     TAKE these medications   acetaminophen 325 MG tablet Commonly known as:   TYLENOL Take 2 tablets (650 mg total) by mouth every 8 (eight) hours as needed.   amoxicillin-clavulanate 875-125 MG tablet Commonly known as:  AUGMENTIN Take 1 tablet by mouth every 12 (twelve) hours for 7 days.   BIOFREEZE 10 % Aero Generic drug:  Menthol Apply 1 applicator topically daily as needed (pain).   HYDROcodone-acetaminophen 5-325 MG tablet Commonly known as:  NORCO/VICODIN Take 1 tablet by mouth every 6 (six) hours as needed for severe pain.   methocarbamol 500 MG tablet Commonly known as:  ROBAXIN Take 1 tablet (500 mg total) by mouth every 8 (eight) hours as needed for muscle spasms.   naproxen sodium 220 MG tablet Commonly known as:  ALEVE Take 220 mg by mouth 2 (two) times daily as needed (pain).        Follow-up Information    Darnell LevelGerkin, Todd, MD. Go on 04/22/2018.   Specialty:  General Surgery Why:  Follow up appointment scheduled for 11:00 AM. Please arrive 30 min prior to appointment time. Bring photo ID and insurance information.  Contact information: [address] [place] [address] [phone]        Norval GableIddyadinesh, Sanjana, DO Follow up.   Specialty:  Family Medicine Why:  Call and let them know you had surgery and follow up on Medical issues. Contact information: [address] [place] [address] [phone]           Signed: Hosie SpangleElizabeth Simaan, [facility] 04/02/2018, 2:59 PM

## 2018-06-16 ENCOUNTER — Ambulatory Visit: Payer: Self-pay | Admitting: Surgery

## 2018-07-13 ENCOUNTER — Encounter (HOSPITAL_COMMUNITY): Payer: Self-pay

## 2018-07-13 NOTE — Patient Instructions (Signed)
Glenn Duncan  07/13/2018   Your procedure is scheduled on: 07-21-18   Report to [facility] Main  Entrance   Report to admitting at     0700  AM    Call this number if you have problems the morning of surgery [phone]    Remember: DRINK 2 PRESURGERY ENSURE DRINKS THE NIGHT BEFORE SURGERY AT  1000 PM AND 1 PRESURGERY DRINK THE DAY OF THE PROCEDURE 3 HOURS PRIOR TO SCHEDULED SURGERY. NO SOLIDS AFTER MIDNIGHT THE DAY PRIOR TO THE SURGERY. NOTHING BY MOUTH EXCEPT CLEAR LIQUIDS UNTIL THREE HOURS PRIOR TO SCHEDULED SURGERY. PLEASE FINISH PRESURGERY ENSURE DRINK PER SURGEON ORDER 3 HOURS PRIOR TO SCHEDULED SURGERY TIME WHICH NEEDS TO BE COMPLETED AT 0600 am then nothing by mouth _________.   FOLLOW A CLEAR LIQUID DIET THE DAY OF YOUR BOWEL PREP . FOLLOW BOWEL PREP PER MD CLEAR LIQUID DIET   Foods Allowed                                                                     Foods Excluded  Coffee and tea, regular and decaf                             liquids that you cannot  Plain Jell-O in any flavor                                             see through such as: Fruit ices (not with fruit pulp)                                     milk, soups, orange juice  Iced Popsicles                                    All solid food Carbonated beverages, regular and diet                                    Cranberry, grape and apple juices Sports drinks like Gatorade Lightly seasoned clear broth or consume(fat free) Sugar, honey syrup  Sample Menu Breakfast                                Lunch                                     Supper Cranberry juice                    Beef broth  Chicken broth Jell-O                                     Grape juice                           Apple juice Coffee or tea                        Jell-O                                      Popsicle                                                Coffee or tea                         Coffee or tea  _____________________________________________________________________    BRUSH YOUR TEETH MORNING OF SURGERY AND RINSE YOUR MOUTH OUT, NO CHEWING GUM CANDY OR MINTS.     Take these medicines the morning of surgery with A SIP OF WATER: none                                You may not have any metal on your body including hair pins and              piercings  Do not wear jewelry, , lotions, powders or perfumes, deodorant .              Men may shave face and neck.   Do not bring valuables to the hospital. [facility] IS NOT             RESPONSIBLE   FOR VALUABLES.  Contacts, dentures or bridgework may not be worn into surgery.  Leave suitcase in the car. After surgery it may be brought to your room.                 Please read over the following fact sheets you were given: _____________________________________________________________________            [facility] - Preparing for Surgery Before surgery, you can play an important role.  Because skin is not sterile, your skin needs to be as free of germs as possible.  You can reduce the number of germs on your skin by washing with CHG (chlorahexidine gluconate) soap before surgery.  CHG is an antiseptic cleaner which kills germs and bonds with the skin to continue killing germs even after washing. Please DO NOT use if you have an allergy to CHG or antibacterial soaps.  If your skin becomes reddened/irritated stop using the CHG and inform your nurse when you arrive at Short Stay. Do not shave (including legs and underarms) for at least 48 hours prior to the first CHG shower.  You may shave your face/neck. Please follow these instructions carefully:  1.  Shower with CHG Soap the night before surgery and the  morning of Surgery.  2.  If you choose to wash your hair, wash your hair first as  usual with your  normal  shampoo.  3.  After you shampoo, rinse your hair and body thoroughly to remove the  shampoo.                            4.  Use CHG as you would any other liquid soap.  You can apply chg directly  to the skin and wash                       Gently with a scrungie or clean washcloth.  5.  Apply the CHG Soap to your body ONLY FROM THE NECK DOWN.   Do not use on face/ open                           Wound or open sores. Avoid contact with eyes, ears mouth and genitals (private parts).                       Wash face,  Genitals (private parts) with your normal soap.             6.  Wash thoroughly, paying special attention to the area where your surgery  will be performed.  7.  Thoroughly rinse your body with warm water from the neck down.  8.  DO NOT shower/wash with your normal soap after using and rinsing off  the CHG Soap.                9.  Pat yourself dry with a clean towel.            10.  Wear clean pajamas.            11.  Place clean sheets on your bed the night of your first shower and do not  sleep with pets. Day of Surgery : Do not apply any lotions/deodorants the morning of surgery.  Please wear clean clothes to the hospital/surgery center.  FAILURE TO FOLLOW THESE INSTRUCTIONS MAY RESULT IN THE CANCELLATION OF YOUR SURGERY PATIENT SIGNATURE_________________________________  NURSE SIGNATURE__________________________________  ________________________________________________________________________   Glenn Duncan  An incentive spirometer is a tool that can help keep your lungs clear and active. This tool measures how well you are filling your lungs with each breath. Taking long deep breaths may help reverse or decrease the chance of developing breathing (pulmonary) problems (especially infection) following:  A long period of time when you are unable to move or be active. BEFORE THE PROCEDURE   If the spirometer includes an indicator to show your best effort, your nurse or respiratory therapist will set it to a desired goal.  If possible, sit up straight or lean slightly  forward. Try not to slouch.  Hold the incentive spirometer in an upright position. INSTRUCTIONS FOR USE  1. Sit on the edge of your bed if possible, or sit up as far as you can in bed or on a chair. 2. Hold the incentive spirometer in an upright position. 3. Breathe out normally. 4. Place the mouthpiece in your mouth and seal your lips tightly around it. 5. Breathe in slowly and as deeply as possible, raising the piston or the ball toward the top of the column. 6. Hold your breath for 3-5 seconds or for as long as possible. Allow the piston or ball to fall to the bottom of the column. 7. Remove the mouthpiece  from your mouth and breathe out normally. 8. Rest for a few seconds and repeat Steps 1 through 7 at least 10 times every 1-2 hours when you are awake. Take your time and take a few normal breaths between deep breaths. 9. The spirometer may include an indicator to show your best effort. Use the indicator as a goal to work toward during each repetition. 10. After each set of 10 deep breaths, practice coughing to be sure your lungs are clear. If you have an incision (the cut made at the time of surgery), support your incision when coughing by placing a pillow or rolled up towels firmly against it. Once you are able to get out of bed, walk around indoors and cough well. You may stop using the incentive spirometer when instructed by your caregiver.  RISKS AND COMPLICATIONS  Take your time so you do not get dizzy or light-headed.  If you are in pain, you may need to take or ask for pain medication before doing incentive spirometry. It is harder to take a deep breath if you are having pain. AFTER USE  Rest and breathe slowly and easily.  It can be helpful to keep track of a log of your progress. Your caregiver can provide you with a simple table to help with this. If you are using the spirometer at home, follow these instructions: Effie IF:   You are having difficultly using the  spirometer.  You have trouble using the spirometer as often as instructed.  Your pain medication is not giving enough relief while using the spirometer.  You develop fever of 100.5 F (38.1 C) or higher. SEEK IMMEDIATE MEDICAL CARE IF:   You cough up bloody sputum that had not been present before.  You develop fever of 102 F (38.9 C) or greater.  You develop worsening pain at or near the incision site. MAKE SURE YOU:   Understand these instructions.  Will watch your condition.  Will get help right away if you are not doing well or get worse. Document Released: 12/09/2006 Document Revised: 10/21/2011 Document Reviewed: 02/09/2007 [facility] Patient Information 2014 [place], [place].   ________________________________________________________________________

## 2018-07-15 ENCOUNTER — Encounter (HOSPITAL_COMMUNITY)
Admission: RE | Admit: 2018-07-15 | Discharge: 2018-07-15 | Disposition: A | Discharge status: Home / Self Care | Payer: Medicare Other | Source: Ambulatory Visit | Attending: Surgery | Admitting: Surgery

## 2018-07-15 ENCOUNTER — Other Ambulatory Visit: Payer: Self-pay

## 2018-07-15 ENCOUNTER — Encounter (HOSPITAL_COMMUNITY): Payer: Self-pay

## 2018-07-15 DIAGNOSIS — Z01812 Encounter for preprocedural laboratory examination: Secondary | ICD-10-CM | POA: Insufficient documentation

## 2018-07-15 HISTORY — DX: Unspecified osteoarthritis, unspecified site: M19.90

## 2018-07-15 HISTORY — DX: Diverticulosis of intestine, part unspecified, without perforation or abscess without bleeding: K57.90

## 2018-07-15 LAB — CBC
HCT: 46.1 % (ref 39.0–52.0)
Hemoglobin: 14.5 g/dL (ref 13.0–17.0)
MCH: 27.7 pg (ref 26.0–34.0)
MCHC: 31.5 g/dL (ref 30.0–36.0)
MCV: 88 fL (ref 80.0–100.0)
Platelets: 306 10*3/uL (ref 150–400)
RBC: 5.24 MIL/uL (ref 4.22–5.81)
RDW: 13.8 % (ref 11.5–15.5)
WBC: 9.1 10*3/uL (ref 4.0–10.5)
nRBC: 0 % (ref 0.0–0.2)

## 2018-07-15 LAB — HEMOGLOBIN A1C
Hgb A1c MFr Bld: 5.2 % (ref 4.8–5.6)
MEAN PLASMA GLUCOSE: 102.54 mg/dL

## 2018-07-15 NOTE — Progress Notes (Signed)
ekg 03-24-18 in epic

## 2018-07-19 ENCOUNTER — Encounter (HOSPITAL_COMMUNITY): Payer: Self-pay | Admitting: Surgery

## 2018-07-19 DIAGNOSIS — K5720 Diverticulitis of large intestine with perforation and abscess without bleeding: Secondary | ICD-10-CM | POA: Diagnosis present

## 2018-07-19 DIAGNOSIS — Z933 Colostomy status: Secondary | ICD-10-CM

## 2018-07-19 NOTE — H&P (Signed)
General Surgery [facility]  Glenn ChenAlbert R Duncan DOB: [birth year] Married / Language: English / Race: White Male   History of Present Illness  The patient is a 67 year old male presenting for a post-operative visit.  CHIEF COMPLAINT: post op Hartmann's resection  Patient returns for first postoperative visit having undergone Hartman's resection for perforated diverticular disease on March 23, 2018. Postoperative course has been remarkably straightforward. His wife is doing home dressing changes to his midline wound. Colostomy is functioning nicely. Patient is anxious to proceed with colostomy closure at some point in the near future. Patient has also had persistent lower back problems and is scheduled for a steroid injection by Dr. Karene FryAndy Poole next month.   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 Medications Reconciled  Social History  Caffeine use  Tea. No alcohol use  Tobacco use  Never smoker.  Family History First Degree Relatives  No pertinent family history   Other Problems  Back Pain  Heart murmur   Review of Systems General Not Present- Appetite Loss, Chills, Fatigue, Fever, Night Sweats, Weight Gain and Weight Loss. Skin Not Present- Change in Wart/Mole, Dryness, Hives, Jaundice, New Lesions, Non-Healing Wounds, Rash and Ulcer. HEENT Not Present- Earache, Hearing Loss, Hoarseness, Nose Bleed, Oral Ulcers, Ringing in the Ears, Seasonal Allergies, Sinus Pain, Sore Throat, Visual Disturbances, Wears glasses/contact lenses and Yellow Eyes. Respiratory Not Present- Bloody sputum, Chronic  Cough, Difficulty Breathing, Snoring and Wheezing. Breast Not Present- Breast Mass, Breast Pain, Nipple Discharge and Skin Changes. Cardiovascular Not Present- Chest Pain, Difficulty Breathing Lying Down, Leg Cramps, Palpitations, Rapid Heart Rate, Shortness of Breath and Swelling of Extremities. Gastrointestinal Not Present- Abdominal Pain, Bloating, Bloody Stool, Change in Bowel Habits, Chronic diarrhea, Constipation, Difficulty Swallowing, Excessive gas, Gets full quickly at meals, Hemorrhoids, Indigestion, Nausea, Rectal Pain and Vomiting. Male Genitourinary Not Present- Blood in Urine, Change in Urinary Stream, Frequency, Impotence, Nocturia, Painful Urination, Urgency and Urine Leakage.  Vitals Weight: 156 lb Height: 68in Body Surface Area: 1.84 m Body Mass Index: 23.72 kg/m  Temp.: 98.65F(Oral)  Pulse: 98 (Regular)  BP: 132/80 (Sitting, Left Arm, Standard)  Physical Exam The physical exam findings are as follows: Note:Limited examination  Midline abdominal incision is healing nicely. Good granulation tissue. No sign of herniation. No sign of infection. Colostomy is normal and functioning well.    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  Call us sooner as needed.  Patient has done very well since his colon resection for perforated diverticulitis. Wound is healing very nicely under the care of the patient's wife.  They will continue twice-daily dressing changes to the midline abdominal wound. At this time they may apply triple antibiotic ointment to the upper portion of the incision and wet to dry dressings to the lower portion of the incision. By next week patient should be able to apply triple antibiotic ointment to the entire wound twice daily. Patient will continue to shower  with the wound open once daily or as needed. After the wound has skin across to  granulation tissue and there is no further drainage, they will discontinue the triple antibiotic ointment and begin to apply cocoa butter cream to the entire incision.  Patient will need to be evaluated by gastroenterology. I have recommended Dr. Vida Rigger at [facility]ology. We will put in a referral for evaluation for colonoscopy prior to colostomy closure.  Patient will return in 6 weeks. At that time we will discuss plans for colostomy closure. He may well be a good candidate for laparoscopic takedown of his colostomy. I may ask one of my colorectal colleagues to participate in that procedure.  Patient will return in 6 weeks.  Glenn Duncan DOB: 14-Mar-1951 Married / Language: English / Race: White Male   History of Present Illness   The patient is a 67 year old male who presents for wound check.  CHIEF COMPLAINT: wound check, plan colostomy closure  Patient returns today to discuss colostomy closure. He has been seen in consultation by Dr. Vida Rigger who will perform colonoscopy immediately prior to the patient's procedure. We discussed the option of laparoscopic colostomy closure versus open colostomy closure. The patient is currently 3 months out from his initial surgery and may proceed with open colostomy closure at this time. I discussed laparoscopic colostomy closure with my colorectal colleagues. They recommend waiting 6 months before colostomy closure. Patient would like to proceed in the near future with colostomy closure by open technique. We discussed the differences between the procedure. Patient has noted some redness along his incision. He is still applying antibiotic ointment to the incision a couple of times a day. Colostomy function has been very good.   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 No alcohol use  Tobacco use  Never smoker.  Family History First Degree Relatives  No pertinent family history   Other Problems Back Pain  Heart murmur   Vitals Weight: 160.2 lb Height: 68in Body Surface Area: 1.86 m Body Mass Index: 24.36 kg/m  Temp.: 98.43F  Pulse: 77 (Regular)  BP: 132/84 (Sitting, Left Arm, Standard)  Physical Exam The physical exam findings are as follows: Note:Limited examination  Midline incision is completely epithelialized. There is some erythema in the distribution of the antibiotic ointment. There is no drainage. There is no sign of hernia. Colostomy is pink and viable and appears to be functioning normally.    Assessment & Plan  DIVERTICULITIS OF LARGE INTESTINE WITH PERFORATION WITHOUT BLEEDING (K57.20)  Patient presents today for wound check and to discuss colostomy closure. We discussed the option of laparoscopic colostomy closure versus open surgery. We discussed the need for preoperative colonoscopy which will be performed by gastroenterology today prior to his surgical procedure.  Patient would like to proceed at this time. In his case, I do not think there will be a huge difference in his postoperative recovery whether he does this by open technique or by laparoscopic technique. We will plan open surgical technique for closure of his colostomy. We discussed the location of the surgical incisions. We discussed the risk of infection and the incisions due to the nature of colorectal surgery. We discussed the hospital stay to be anticipated. Patient would like to proceed with surgery in  the near  future. We will coordinate this with gastroenterology.  The risks and benefits of the procedure have been discussed at length with the patient. The patient understands the proposed procedure, potential alternative treatments, and the course of recovery to be expected. All of the patient's questions have been answered at this time. The patient wishes to proceed with surgery.  Darnell Level, MD [facility] Surgery Office: [phone]

## 2018-07-21 ENCOUNTER — Encounter (HOSPITAL_COMMUNITY): Payer: Self-pay | Admitting: General Practice

## 2018-07-21 ENCOUNTER — Inpatient Hospital Stay (HOSPITAL_COMMUNITY): Payer: Medicare Other | Admitting: Anesthesiology

## 2018-07-21 ENCOUNTER — Inpatient Hospital Stay (HOSPITAL_COMMUNITY)
Admission: RE | Admit: 2018-07-21 | Discharge: 2018-07-24 | DRG: 331 | Disposition: A | Discharge status: Home / Self Care | Payer: Medicare Other | Attending: Surgery | Admitting: Surgery

## 2018-07-21 ENCOUNTER — Encounter (HOSPITAL_COMMUNITY)
Admission: RE | Disposition: A | Discharge status: Home / Self Care | Payer: Self-pay | Source: Home / Self Care | Attending: Surgery

## 2018-07-21 ENCOUNTER — Other Ambulatory Visit: Payer: Self-pay

## 2018-07-21 DIAGNOSIS — M1990 Unspecified osteoarthritis, unspecified site: Secondary | ICD-10-CM | POA: Diagnosis present

## 2018-07-21 DIAGNOSIS — Z433 Encounter for attention to colostomy: Principal | ICD-10-CM

## 2018-07-21 DIAGNOSIS — Z933 Colostomy status: Secondary | ICD-10-CM

## 2018-07-21 DIAGNOSIS — K5720 Diverticulitis of large intestine with perforation and abscess without bleeding: Secondary | ICD-10-CM | POA: Diagnosis present

## 2018-07-21 DIAGNOSIS — K5781 Diverticulitis of intestine, part unspecified, with perforation and abscess with bleeding: Secondary | ICD-10-CM | POA: Diagnosis present

## 2018-07-21 HISTORY — PX: COLOSTOMY TAKEDOWN: SHX5783

## 2018-07-21 LAB — CBC
HCT: 38.2 % — ABNORMAL LOW (ref 39.0–52.0)
Hemoglobin: 12.5 g/dL — ABNORMAL LOW (ref 13.0–17.0)
MCH: 28.3 pg (ref 26.0–34.0)
MCHC: 32.7 g/dL (ref 30.0–36.0)
MCV: 86.4 fL (ref 80.0–100.0)
NRBC: 0 % (ref 0.0–0.2)
Platelets: 238 10*3/uL (ref 150–400)
RBC: 4.42 MIL/uL (ref 4.22–5.81)
RDW: 13 % (ref 11.5–15.5)
WBC: 15.9 10*3/uL — ABNORMAL HIGH (ref 4.0–10.5)

## 2018-07-21 LAB — CREATININE, SERUM
Creatinine, Ser: 1.07 mg/dL (ref 0.61–1.24)
GFR calc non Af Amer: >60 mL/min (ref >60)

## 2018-07-21 SURGERY — CLOSURE, COLOSTOMY
Anesthesia: General

## 2018-07-21 MED ORDER — ONDANSETRON HCL 4 MG/2ML IJ SOLN
INTRAMUSCULAR | Status: AC
  Filled 2018-07-21: qty 2

## 2018-07-21 MED ORDER — ALBUMIN HUMAN 5 % IV SOLN
INTRAVENOUS | Status: DC | PRN
  Administered 2018-07-21 (×2): via INTRAVENOUS

## 2018-07-21 MED ORDER — SODIUM CHLORIDE 0.9 % IV SOLN
1.0000 g | INTRAVENOUS | Status: DC
  Filled 2018-07-21: qty 1

## 2018-07-21 MED ORDER — LIDOCAINE 2% (20 MG/ML) 5 ML SYRINGE: INTRAMUSCULAR | Status: DC | PRN

## 2018-07-21 MED ORDER — ACETAMINOPHEN 10 MG/ML IV SOLN
INTRAVENOUS | Status: AC
  Filled 2018-07-21: qty 100

## 2018-07-21 MED ORDER — PROPOFOL 10 MG/ML IV BOLUS
INTRAVENOUS | Status: DC | PRN
  Administered 2018-07-21: 160 mg via INTRAVENOUS

## 2018-07-21 MED ORDER — KETAMINE HCL 10 MG/ML IJ SOLN
INTRAMUSCULAR | Status: DC | PRN
  Administered 2018-07-21: 15 mg via INTRAVENOUS
  Administered 2018-07-21: 35 mg via INTRAVENOUS

## 2018-07-21 MED ORDER — DEXAMETHASONE SODIUM PHOSPHATE 10 MG/ML IJ SOLN
INTRAMUSCULAR | Status: DC | PRN
  Administered 2018-07-21: 10 mg via INTRAVENOUS

## 2018-07-21 MED ORDER — SUGAMMADEX SODIUM 200 MG/2ML IV SOLN
INTRAVENOUS | Status: DC | PRN
  Administered 2018-07-21: 150 mg via INTRAVENOUS

## 2018-07-21 MED ORDER — ALBUMIN HUMAN 5 % IV SOLN
INTRAVENOUS | Status: AC
  Filled 2018-07-21: qty 250

## 2018-07-21 MED ORDER — HYDROMORPHONE HCL 1 MG/ML IJ SOLN
0.2500 mg | INTRAMUSCULAR | Status: DC | PRN
  Administered 2018-07-21 (×4): 0.5 mg via INTRAVENOUS

## 2018-07-21 MED ORDER — KCL IN DEXTROSE-NACL 20-5-0.45 MEQ/L-%-% IV SOLN
INTRAVENOUS | Status: DC
  Administered 2018-07-21 – 2018-07-23 (×5): via INTRAVENOUS
  Filled 2018-07-21 (×6): qty 1000

## 2018-07-21 MED ORDER — FENTANYL CITRATE (PF) 250 MCG/5ML IJ SOLN
INTRAMUSCULAR | Status: AC
  Filled 2018-07-21: qty 5

## 2018-07-21 MED ORDER — 0.9 % SODIUM CHLORIDE (POUR BTL) OPTIME
TOPICAL | Status: DC | PRN
  Administered 2018-07-21: 3000 mL

## 2018-07-21 MED ORDER — DEXAMETHASONE SODIUM PHOSPHATE 10 MG/ML IJ SOLN
INTRAMUSCULAR | Status: AC
  Filled 2018-07-21: qty 1

## 2018-07-21 MED ORDER — FENTANYL CITRATE (PF) 100 MCG/2ML IJ SOLN
INTRAMUSCULAR | Status: DC | PRN
  Administered 2018-07-21 (×3): 50 ug via INTRAVENOUS
  Administered 2018-07-21: 100 ug via INTRAVENOUS
  Administered 2018-07-21 (×4): 50 ug via INTRAVENOUS

## 2018-07-21 MED ORDER — KETAMINE HCL 10 MG/ML IJ SOLN
INTRAMUSCULAR | Status: AC
  Filled 2018-07-21: qty 1

## 2018-07-21 MED ORDER — FENTANYL CITRATE (PF) 100 MCG/2ML IJ SOLN
INTRAMUSCULAR | Status: AC
  Filled 2018-07-21: qty 4

## 2018-07-21 MED ORDER — HYDROMORPHONE HCL 1 MG/ML IJ SOLN
INTRAMUSCULAR | Status: AC
  Filled 2018-07-21: qty 1

## 2018-07-21 MED ORDER — HYDROCODONE-ACETAMINOPHEN 5-325 MG PO TABS: 1.0000 | ORAL_TABLET | ORAL | Status: DC | PRN

## 2018-07-21 MED ORDER — HYDROMORPHONE HCL 1 MG/ML IJ SOLN
INTRAMUSCULAR | Status: AC
  Filled 2018-07-21: qty 2

## 2018-07-21 MED ORDER — CHLORHEXIDINE GLUCONATE CLOTH 2 % EX PADS: 6.0000 | MEDICATED_PAD | Freq: Once | CUTANEOUS | Status: DC

## 2018-07-21 MED ORDER — ENSURE SURGERY PO LIQD
237.0000 mL | Freq: Two times a day (BID) | ORAL | Status: DC
  Administered 2018-07-21: 237 mL via ORAL
  Filled 2018-07-21 (×7): qty 237

## 2018-07-21 MED ORDER — HYDROMORPHONE HCL 1 MG/ML IJ SOLN
INTRAMUSCULAR | Status: DC | PRN
  Administered 2018-07-21: 1 mg via INTRAVENOUS

## 2018-07-21 MED ORDER — ALVIMOPAN 12 MG PO CAPS: 12.0000 mg | ORAL_CAPSULE | ORAL | Status: DC

## 2018-07-21 MED ORDER — ONDANSETRON HCL 4 MG/2ML IJ SOLN: 4.0000 mg | Freq: Four times a day (QID) | INTRAMUSCULAR | Status: DC | PRN

## 2018-07-21 MED ORDER — LIDOCAINE 2% (20 MG/ML) 5 ML SYRINGE
INTRAMUSCULAR | Status: DC | PRN
  Administered 2018-07-21: 1.5 mg/kg/h via INTRAVENOUS

## 2018-07-21 MED ORDER — ENOXAPARIN SODIUM 40 MG/0.4ML ~~LOC~~ SOLN
40.0000 mg | SUBCUTANEOUS | Status: DC
  Administered 2018-07-22 – 2018-07-24 (×3): 40 mg via SUBCUTANEOUS
  Filled 2018-07-21 (×3): qty 0.4

## 2018-07-21 MED ORDER — ACETAMINOPHEN 10 MG/ML IV SOLN
1000.0000 mg | Freq: Once | INTRAVENOUS | Status: AC
  Administered 2018-07-21: 1000 mg via INTRAVENOUS

## 2018-07-21 MED ORDER — LIDOCAINE 2% (20 MG/ML) 5 ML SYRINGE
INTRAMUSCULAR | Status: AC
  Filled 2018-07-21: qty 5

## 2018-07-21 MED ORDER — EPHEDRINE SULFATE-NACL 50-0.9 MG/10ML-% IV SOSY
PREFILLED_SYRINGE | INTRAVENOUS | Status: DC | PRN
  Administered 2018-07-21: 10 mg via INTRAVENOUS

## 2018-07-21 MED ORDER — HYDROMORPHONE HCL 1 MG/ML IJ SOLN: 1.0000 mg | INTRAMUSCULAR | Status: DC | PRN

## 2018-07-21 MED ORDER — LIDOCAINE 2% (20 MG/ML) 5 ML SYRINGE
INTRAMUSCULAR | Status: DC | PRN
  Administered 2018-07-21: 50 mg via INTRAVENOUS

## 2018-07-21 MED ORDER — TRAMADOL HCL 50 MG PO TABS
50.0000 mg | ORAL_TABLET | Freq: Four times a day (QID) | ORAL | Status: DC | PRN
  Administered 2018-07-21: 100 mg via ORAL
  Filled 2018-07-21: qty 2

## 2018-07-21 MED ORDER — CELECOXIB 200 MG PO CAPS
200.0000 mg | ORAL_CAPSULE | Freq: Two times a day (BID) | ORAL | Status: DC
  Administered 2018-07-21 – 2018-07-24 (×7): 200 mg via ORAL
  Filled 2018-07-21 (×7): qty 1

## 2018-07-21 MED ORDER — ONDANSETRON HCL 4 MG/2ML IJ SOLN
INTRAMUSCULAR | Status: DC | PRN
  Administered 2018-07-21: 4 mg via INTRAVENOUS

## 2018-07-21 MED ORDER — ONDANSETRON HCL 4 MG PO TABS: 4.0000 mg | ORAL_TABLET | Freq: Four times a day (QID) | ORAL | Status: DC | PRN

## 2018-07-21 MED ORDER — LIDOCAINE HCL 2 % IJ SOLN
INTRAMUSCULAR | Status: AC
  Filled 2018-07-21: qty 20

## 2018-07-21 MED ORDER — ROCURONIUM BROMIDE 10 MG/ML (PF) SYRINGE
PREFILLED_SYRINGE | INTRAVENOUS | Status: AC
  Filled 2018-07-21: qty 10

## 2018-07-21 MED ORDER — ROCURONIUM BROMIDE 10 MG/ML (PF) SYRINGE
PREFILLED_SYRINGE | INTRAVENOUS | Status: DC | PRN
  Administered 2018-07-21 (×2): 20 mg via INTRAVENOUS
  Administered 2018-07-21: 50 mg via INTRAVENOUS

## 2018-07-21 MED ORDER — SUGAMMADEX SODIUM 200 MG/2ML IV SOLN
INTRAVENOUS | Status: AC
  Filled 2018-07-21: qty 2

## 2018-07-21 MED ORDER — ALVIMOPAN 12 MG PO CAPS
12.0000 mg | ORAL_CAPSULE | ORAL | Status: AC
  Administered 2018-07-21: 12 mg via ORAL
  Filled 2018-07-21: qty 1

## 2018-07-21 MED ORDER — FENTANYL CITRATE (PF) 100 MCG/2ML IJ SOLN
25.0000 ug | INTRAMUSCULAR | Status: DC | PRN
  Administered 2018-07-21 (×2): 50 ug via INTRAVENOUS

## 2018-07-21 MED ORDER — PROPOFOL 10 MG/ML IV BOLUS
INTRAVENOUS | Status: AC
  Filled 2018-07-21: qty 20

## 2018-07-21 MED ORDER — LACTATED RINGERS IV SOLN
INTRAVENOUS | Status: DC
  Administered 2018-07-21 (×2): via INTRAVENOUS

## 2018-07-21 MED ORDER — GABAPENTIN 300 MG PO CAPS
300.0000 mg | ORAL_CAPSULE | Freq: Two times a day (BID) | ORAL | Status: DC
  Administered 2018-07-21 – 2018-07-24 (×7): 300 mg via ORAL
  Filled 2018-07-21 (×7): qty 1

## 2018-07-21 MED ORDER — SODIUM CHLORIDE 0.9 % IV SOLN
2.0000 g | INTRAVENOUS | Status: AC
  Administered 2018-07-21: 2 g via INTRAVENOUS
  Filled 2018-07-21: qty 2

## 2018-07-21 SURGICAL SUPPLY — 54 items
APPLICATOR COTTON TIP 6 STRL (MISCELLANEOUS) IMPLANT
APPLICATOR COTTON TIP 6IN STRL (MISCELLANEOUS)
BLADE EXTENDED COATED 6.5IN (ELECTRODE) IMPLANT
BLADE HEX COATED 2.75 (ELECTRODE) ×3 IMPLANT
CHLORAPREP W/TINT 26ML (MISCELLANEOUS) IMPLANT
CLIP VESOCCLUDE LG 6/CT (CLIP) IMPLANT
COVER SURGICAL LIGHT HANDLE (MISCELLANEOUS) ×3 IMPLANT
COVER WAND RF STERILE (DRAPES) ×3 IMPLANT
DRAPE LAPAROSCOPIC ABDOMINAL (DRAPES) ×3 IMPLANT
DRAPE SURG IRRIG POUCH 19X23 (DRAPES) ×3 IMPLANT
DRSG OPSITE POSTOP 4X10 (GAUZE/BANDAGES/DRESSINGS) ×3 IMPLANT
ELECT REM PT RETURN 15FT ADLT (MISCELLANEOUS) ×3 IMPLANT
EVACUATOR DRAINAGE 10X20 100CC (DRAIN) IMPLANT
EVACUATOR SILICONE 100CC (DRAIN)
GAUZE SPONGE 4X4 12PLY STRL (GAUZE/BANDAGES/DRESSINGS) ×3 IMPLANT
GLOVE BIO SURGEON STRL SZ7 (GLOVE) ×3 IMPLANT
GLOVE BIOGEL PI IND STRL 6.5 (GLOVE) ×2 IMPLANT
GLOVE BIOGEL PI IND STRL 7.0 (GLOVE) ×2 IMPLANT
GLOVE BIOGEL PI IND STRL 7.5 (GLOVE) ×1 IMPLANT
GLOVE BIOGEL PI INDICATOR 6.5 (GLOVE) ×4
GLOVE BIOGEL PI INDICATOR 7.0 (GLOVE) ×4
GLOVE BIOGEL PI INDICATOR 7.5 (GLOVE) ×2
GLOVE ECLIPSE 6.5 STRL STRAW (GLOVE) ×6 IMPLANT
GLOVE SURG ORTHO 8.0 STRL STRW (GLOVE) ×6 IMPLANT
GOWN STRL REUS W/ TWL LRG LVL3 (GOWN DISPOSABLE) ×2 IMPLANT
GOWN STRL REUS W/TWL LRG LVL3 (GOWN DISPOSABLE) ×4
GOWN STRL REUS W/TWL XL LVL3 (GOWN DISPOSABLE) ×12 IMPLANT
HANDLE SUCTION POOLE (INSTRUMENTS) ×1 IMPLANT
KIT SIGMOIDOSCOPE (SET/KITS/TRAYS/PACK) ×3 IMPLANT
LEGGING LITHOTOMY PAIR STRL (DRAPES) ×3 IMPLANT
PACK COLON (CUSTOM PROCEDURE TRAY) ×3 IMPLANT
PACK GENERAL/GYN (CUSTOM PROCEDURE TRAY) IMPLANT
SHEARS HARMONIC ACE PLUS 36CM (ENDOMECHANICALS) IMPLANT
SPONGE LAP 18X18 X RAY DECT (DISPOSABLE) ×6 IMPLANT
STAPLER CIRC CVD 29MM 37CM (STAPLE) ×3 IMPLANT
STAPLER VISISTAT 35W (STAPLE) ×3 IMPLANT
SUCTION POOLE HANDLE (INSTRUMENTS) ×3
SURGILUBE 2OZ TUBE FLIPTOP (MISCELLANEOUS) ×3 IMPLANT
SUT ETHILON 3 0 PS 1 (SUTURE) IMPLANT
SUT NOV 1 T60/GS (SUTURE) IMPLANT
SUT NOVA NAB DX-16 0-1 5-0 T12 (SUTURE) ×18 IMPLANT
SUT NOVA T20/GS 25 (SUTURE) IMPLANT
SUT PROLENE 2 0 SH DA (SUTURE) ×3 IMPLANT
SUT SILK 2 0 (SUTURE) ×2
SUT SILK 2 0 SH CR/8 (SUTURE) ×6 IMPLANT
SUT SILK 2 0SH CR/8 30 (SUTURE) IMPLANT
SUT SILK 2-0 18XBRD TIE 12 (SUTURE) ×1 IMPLANT
SUT SILK 2-0 30XBRD TIE 12 (SUTURE) IMPLANT
SUT SILK 3 0 (SUTURE) ×2
SUT SILK 3 0 SH CR/8 (SUTURE) ×3 IMPLANT
SUT SILK 3-0 18XBRD TIE 12 (SUTURE) ×1 IMPLANT
SUT VIC AB 4-0 SH 18 (SUTURE) IMPLANT
TRAY FOLEY MTR SLVR 16FR STAT (SET/KITS/TRAYS/PACK) ×3 IMPLANT
YANKAUER SUCT BULB TIP NO VENT (SUCTIONS) IMPLANT

## 2018-07-21 NOTE — Anesthesia Procedure Notes (Signed)
Procedure Name: Intubation Date/Time: 07/21/2018 9:33 AM Performed by: Anne Fu, CRNA Pre-anesthesia Checklist: Patient identified, Emergency Drugs available, Suction available, Patient being monitored and Timeout performed Patient Re-evaluated:Patient Re-evaluated prior to induction Oxygen Delivery Method: Circle system utilized Preoxygenation: Pre-oxygenation with 100% oxygen Induction Type: IV induction Ventilation: Mask ventilation without difficulty Laryngoscope Size: Mac and 4 Grade View: Grade I Tube type: Oral Tube size: 7.5 mm Number of attempts: 1 Airway Equipment and Method: Stylet Placement Confirmation: ETT inserted through vocal cords under direct vision,  positive ETCO2 and breath sounds checked- equal and bilateral Secured at: 23 cm Tube secured with: Tape Dental Injury: Teeth and Oropharynx as per pre-operative assessment

## 2018-07-21 NOTE — Interval H&P Note (Signed)
History and Physical Interval Note:  07/21/2018 9:07 AM  Glenn Duncan  has presented today for surgery, with the diagnosis of diverticulitis disease, presence of colostomy.  The various methods of treatment have been discussed with the patient and family. After consideration of risks, benefits and other options for treatment, the patient has consented to    Procedure(s): OPEN COLOSTOMY CLOSURE (N/A) as a surgical intervention .    The patient's history has been reviewed, patient examined, no change in status, stable for surgery.  I have reviewed the patient's chart and labs.  Questions were answered to the patient's satisfaction.    Darnell Levelodd Rondey Fallen, MD [facility] Office: [phone]    [name] MJudie Petit

## 2018-07-21 NOTE — Transfer of Care (Signed)
Immediate Anesthesia Transfer of Care Note  Patient: Glenn BeersAlbert Duncan  Procedure(s) Performed: Procedure(s): OPEN COLOSTOMY CLOSURE, RIGID PROCTOSCOPY (N/A)  Patient Location: PACU  Anesthesia Type:General  Level of Consciousness:  sedated, patient cooperative and responds to stimulation  Airway & Oxygen Therapy:Patient Spontanous Breathing and Patient connected to face mask oxgen  Post-op Assessment:  Report given to PACU RN and Post -op Vital signs reviewed and stable  Post vital signs:  Reviewed and stable  Last Vitals:  Vitals:   07/21/18 0734  BP: (!) 140/91  Pulse: 85  Resp: 16  Temp: 36.6 C  SpO2: 98%    Complications: No apparent anesthesia complications

## 2018-07-21 NOTE — Op Note (Signed)
Operative Note  Pre-operative Diagnosis:  History of diverticular disease with perforation, presence of colostomy  Post-operative Diagnosis:  same  Surgeon:  Darnell Levelodd Sinahi Knights, MD  Assistant:  Emelia LoronMatthew Wakefield, MD   Procedure:  Colostomy closure  Anesthesia:  general  Estimated Blood Loss:  150cc  Drains: none         Specimen: stoma to pathology  Indications:  Patient returns for first postoperative visit having undergone Hartman's resection for perforated diverticular disease on March 23, 2018. Postoperative course has been remarkably straightforward. His wife is doing home dressing changes to his midline wound. Colostomy is functioning nicely. Patient is anxious to proceed with colostomy closure.  Preoperative colonoscopy was performed by Dr. Vida RiggerMarc Magod.  Aside from a few scattered diverticuli, there were no pathologic findings.  Patient now comes to the operating room for colostomy closure.  Procedure Details:  The patient was seen in the pre-op holding area. The risks, benefits, complications, treatment options, and expected outcomes were previously discussed with the patient. The patient agreed with the proposed plan and has signed the informed consent form.  The patient was brought to the operating room by the surgical team, identified as Alfonzo BeersAlbert Olaes and the procedure verified. A "time out" was completed and the above information confirmed.  Following induction of general endotracheal anesthesia, the patient was placed in lithotomy and prepped and draped in the usual aseptic fashion.  Colostomy was sutured closed with a 2-0 silk figure-of-eight suture.  Previous midline scar is excised sharply with a #10 blade.  Dissection was carried through subcutaneous tissues using electrocautery for hemostasis.  Suture material from the midline is removed.  Peritoneal cavity is entered cautiously.  Scattered adhesions are taken down sharply and with the use of the electrocautery.  There are  relatively few adhesions.  The descending colon he is freed from its peritoneal attachments at the site of colostomy in the left mid abdominal wall.  An elliptical incision is made around the colostomy on the skin.  Dissection was carried in the subcutaneous tissues.  Adhesions are divided with the electrocautery and the stoma is completely freed from the skin, subcutaneous tissues, and abdominal wall and reduced back within the peritoneal cavity.  A Balfour retractor was placed for exposure.  Small bowel and omentum are retracted cephalad and packed with sponges.  The pelvis is explored and the rectal stump was identified.  It was marked with Prolene sutures.  Again there are relatively few adhesions in the pelvis.  There is some inspissated stool within the rectum which is mobilized and expressed distally.  Mesentery is taken down off of the stoma and divided between hemostats with electrocautery and ligated with 2-0 silk ties.  Stay sutures are placed on either side of the bowel.  The bowel is transected with electrocautery and the stoma is excised and submitted to pathology.  Next a 2-0 Prolene pursestring suture is placed at the end of the descending colon.  The bowel is sized and a 29 sizer passes easily into the lumen of the bowel.  An Ethicon 29 mm EEA stapler is selected and opened onto the field.  Anvil is inserted into the distal bowel and the pursestring suture is tied securely.  Lateral peritoneum along the colon is incised with the electrocautery to provide for adequate mobility.  The anvil reaches easily into the pelvis without any tension.  Digital rectal exam was performed and inspissated stool was removed from the rectum.  Rectum was then dilated sequentially with the  dilators.  Dilators were easily passed up to the rectal stump.  EEA stapler was inserted and advanced to the rectal stump.  It is angled anteriorly so that the anastomosis will be performed through the anterior rectal wall.   Trocar is advanced.  Anvil is attached and the stapler is closed in the usual fashion.  Stapler is fired and removed in the usual fashion.  Abdomen is filled with saline.  Using a rigid proctoscope air is insufflated into the rectum while occluding this is the descending colon manually.  The bowel is distended until tense and there is no evidence of air leakage from the anastomosis.  Air is then evacuated from the bowel.  Peritoneal cavity is irrigated with warm saline which is evacuated.  At this point gowns and gloves and drapes are changed in preparation for closing.  Stoma site fascia was closed with interrupted #1 Novafil simple sutures.  Midline abdominal incision was then closed with interrupted #1 Novafil simple sutures.  Subcutaneous tissues are irrigated.  Skin of both incisions are closed with stainless steel staples.  Honeycomb dressings are applied.  Patient was awakened from anesthesia and brought to the recovery room in stable condition.  The patient tolerated the procedure well.   Darnell Level, MD [facility] Office: [phone]

## 2018-07-21 NOTE — Anesthesia Preprocedure Evaluation (Addendum)
Anesthesia Evaluation  Patient identified by MRN, date of birth, ID band Patient awake    Reviewed: Allergy & Precautions, NPO status , Patient's Chart, lab work & pertinent test results  Airway Mallampati: III  TM Distance: >3 FB Neck ROM: Full    Dental no notable dental hx. (+) Teeth Intact, Dental Advisory Given   Pulmonary neg pulmonary ROS,    Pulmonary exam normal breath sounds clear to auscultation       Cardiovascular negative cardio ROS Normal cardiovascular exam Rhythm:Regular Rate:Normal     Neuro/Psych negative neurological ROS  negative psych ROS   GI/Hepatic negative GI ROS, Neg liver ROS,   Endo/Other  negative endocrine ROS  Renal/GU negative Renal ROS  negative genitourinary   Musculoskeletal  (+) Arthritis , Osteoarthritis,    Abdominal   Peds  Hematology negative hematology ROS (+)   Anesthesia Other Findings Hartman's resection for perforated diverticular disease 03/2018 now presenting for colostomy closure  Reproductive/Obstetrics                           Anesthesia Physical Anesthesia Plan  ASA: II  Anesthesia Plan: General   Post-op Pain Management:    Induction: Intravenous  PONV Risk Score and Plan: 2 and Treatment may vary due to age or medical condition, Dexamethasone and Ondansetron  Airway Management Planned: Oral ETT  Additional Equipment:   Intra-op Plan:   Post-operative Plan: Extubation in OR  Informed Consent: I have reviewed the patients History and Physical, chart, labs and discussed the procedure including the risks, benefits and alternatives for the proposed anesthesia with the patient or authorized representative who has indicated his/her understanding and acceptance.   Dental advisory given  Plan Discussed with: CRNA  Anesthesia Plan Comments:         Anesthesia Quick Evaluation

## 2018-07-22 ENCOUNTER — Encounter (HOSPITAL_COMMUNITY): Payer: Self-pay | Admitting: Surgery

## 2018-07-22 LAB — BASIC METABOLIC PANEL
Anion gap: 9 (ref 5–15)
BUN: 9 mg/dL (ref 8–23)
CALCIUM: 8.4 mg/dL — AB (ref 8.9–10.3)
CO2: 24 mmol/L (ref 22–32)
Chloride: 103 mmol/L (ref 98–111)
Creatinine, Ser: 0.77 mg/dL (ref 0.61–1.24)
GFR calc Af Amer: >60 mL/min (ref >60)
GFR calc non Af Amer: >60 mL/min (ref >60)
Glucose, Bld: 160 mg/dL — ABNORMAL HIGH (ref 70–99)
Potassium: 4.1 mmol/L (ref 3.5–5.1)
Sodium: 136 mmol/L (ref 135–145)

## 2018-07-22 LAB — CBC
HCT: 31.2 % — ABNORMAL LOW (ref 39.0–52.0)
Hemoglobin: 10.2 g/dL — ABNORMAL LOW (ref 13.0–17.0)
MCH: 27.5 pg (ref 26.0–34.0)
MCHC: 32.7 g/dL (ref 30.0–36.0)
MCV: 84.1 fL (ref 80.0–100.0)
Platelets: 274 10*3/uL (ref 150–400)
RBC: 3.71 MIL/uL — ABNORMAL LOW (ref 4.22–5.81)
RDW: 13.1 % (ref 11.5–15.5)
WBC: 17.8 10*3/uL — ABNORMAL HIGH (ref 4.0–10.5)
nRBC: 0 % (ref 0.0–0.2)

## 2018-07-22 MED ORDER — LIP MEDEX EX OINT
TOPICAL_OINTMENT | CUTANEOUS | Status: AC
  Filled 2018-07-22: qty 7

## 2018-07-22 MED ORDER — LIP MEDEX EX OINT: TOPICAL_OINTMENT | CUTANEOUS | Status: DC | PRN

## 2018-07-22 MED ORDER — TRAMADOL HCL 50 MG PO TABS: 50.0000 mg | ORAL_TABLET | Freq: Four times a day (QID) | ORAL | Status: DC | PRN

## 2018-07-22 MED ORDER — ALVIMOPAN 12 MG PO CAPS
12.0000 mg | ORAL_CAPSULE | Freq: Two times a day (BID) | ORAL | Status: DC
  Administered 2018-07-22: 12 mg via ORAL
  Filled 2018-07-22 (×2): qty 1

## 2018-07-22 NOTE — Anesthesia Postprocedure Evaluation (Signed)
Anesthesia Post Note  Patient: Glenn Duncan  Procedure(s) Performed: OPEN COLOSTOMY CLOSURE, RIGID PROCTOSCOPY (N/A )     Patient location during evaluation: PACU Anesthesia Type: General Level of consciousness: awake and alert Pain management: pain level controlled Vital Signs Assessment: post-procedure vital signs reviewed and stable Respiratory status: spontaneous breathing, nonlabored ventilation, respiratory function stable and patient connected to nasal cannula oxygen Cardiovascular status: blood pressure returned to baseline and stable Postop Assessment: no apparent nausea or vomiting Anesthetic complications: no    Last Vitals:  Vitals:   07/22/18 0151 07/22/18 0520  BP: 129/78 (!) 144/80  Pulse: 81 77  Resp: 17 20  Temp: 37 C 36.9 C  SpO2: 96% 98%    Last Pain:  Vitals:   07/22/18 0824  TempSrc:   PainSc: 3                  Leyda Vanderwerf L Annmargaret Decaprio

## 2018-07-22 NOTE — Progress Notes (Signed)
     Assessment & Plan: POD#1 - status post colostomy closure  Beginning clear liquid diet  Foley out this AM  Ambulate in halls        Darnell Levelodd Zanyah Lentsch, MD       [facility]       Office: [phone]   Chief Complaint: Colostomy closure  Subjective: Patient in bed, comfortable.  Beginning clear liquid diet this AM.  Voided after Foley out.  Objective: Vital signs in last 24 hours: Temp:  [97.7 F (36.5 C)-99.2 F (37.3 C)] 98.4 F (36.9 C) (12/11 0520) Pulse Rate:  [72-86] 77 (12/11 0520) Resp:  [11-24] 20 (12/11 0520) BP: (129-166)/(76-95) 144/80 (12/11 0520) SpO2:  [96 %-100 %] 98 % (12/11 0520) Last BM Date: 07/22/18  Intake/Output from previous day: 12/10 0701 - 12/11 0700 In: 5393.1 [P.O.:480; I.V.:4366.4; IV Piggyback:546.7] Out: 1116 [Urine:1015; Stool:1; Blood:100] Intake/Output this shift: No intake/output data recorded.  Physical Exam: HEENT - sclerae clear, mucous membranes moist Neck - soft Chest - clear bilaterally Cor - RRR Abdomen - soft without distension; small serosanguinous on lower midline dressing; BS present Ext - no edema, non-tender Neuro - alert & oriented, no focal deficits  Lab Results:  Recent Labs    07/21/18 1414 07/22/18 0516  WBC 15.9* 17.8*  HGB 12.5* 10.2*  HCT 38.2* 31.2*  PLT 238 274   BMET Recent Labs    07/21/18 1414 07/22/18 0516  NA  --  136  K  --  4.1  CL  --  103  CO2  --  24  GLUCOSE  --  160*  BUN  --  9  CREATININE 1.07 0.77  CALCIUM  --  8.4*   PT/INR No results for input(s): LABPROT, INR in the last 72 hours. Comprehensive Metabolic Panel:    Component Value Date/Time   NA 136 07/22/2018 0516   NA 139 03/27/2018 0430   K 4.1 07/22/2018 0516   K 3.4 (L) 03/27/2018 0430   CL 103 07/22/2018 0516   CL 106 03/27/2018 0430   CO2 24 07/22/2018 0516   CO2 25 03/27/2018 0430   BUN 9 07/22/2018 0516   BUN 14 03/27/2018 0430   CREATININE 0.77 07/22/2018 0516   CREATININE 1.07  07/21/2018 1414   GLUCOSE 160 (H) 07/22/2018 0516   GLUCOSE 100 (H) 03/27/2018 0430   CALCIUM 8.4 (L) 07/22/2018 0516   CALCIUM 8.5 (L) 03/27/2018 0430   AST 19 03/24/2018 0425   AST 42 (H) 03/23/2018 0618   ALT 26 03/24/2018 0425   ALT 48 (H) 03/23/2018 0618   ALKPHOS 57 03/24/2018 0425   ALKPHOS 93 03/23/2018 0618   BILITOT 0.9 03/24/2018 0425   BILITOT 1.2 03/23/2018 0618   PROT 5.7 (L) 03/24/2018 0425   PROT 8.0 03/23/2018 0618   ALBUMIN 2.6 (L) 03/24/2018 0425   ALBUMIN 3.9 03/23/2018 0618    Studies/Results: No results found.    Marry Kusch M 07/22/2018  Patient ID: Alfonzo BeersAlbert Enderle, male   DOB: May 20, 1951, 67 y.o.   MRN: [id number]

## 2018-07-23 NOTE — Progress Notes (Signed)
GI social visit patient looks well in good spirits scar looks good please let me know if I can be of any further assistance with his hospital stay

## 2018-07-23 NOTE — Progress Notes (Signed)
     Assessment & Plan: POD#2 - status post colostomy closure             Begin full liquid diet             Ambulate in halls        Darnell Levelodd Neera Teng, MD       [facility]       Office: [phone]   Chief Complaint: Colostomy closure  Subjective: Patient doing well.  Several small soft BM's with small blood.  Voiding.  Pain better.  Ambulating.  Objective: Vital signs in last 24 hours: Temp:  [98.4 F (36.9 C)-98.6 F (37 C)] 98.4 F (36.9 C) (12/12 0542) Pulse Rate:  [79-83] 79 (12/12 0542) Resp:  [18] 18 (12/12 0542) BP: (127-129)/(62-70) 129/62 (12/12 0542) SpO2:  [96 %-97 %] 96 % (12/12 0542) Weight:  [74.7 kg] 74.7 kg (12/12 0542) Last BM Date: 07/23/18  Intake/Output from previous day: 12/11 0701 - 12/12 0700 In: 2023.4 [P.O.:420; I.V.:1603.4] Out: 2125 [Urine:2125] Intake/Output this shift: Total I/O In: 740 [P.O.:240; I.V.:500] Out: 700 [Urine:700]  Physical Exam: HEENT - sclerae clear, mucous membranes moist Neck - soft Chest - clear bilaterally Cor - RRR Abdomen - soft without distension; BS present; wounds dry and intact Ext - no edema, non-tender Neuro - alert & oriented, no focal deficits  Lab Results:  Recent Labs    07/21/18 1414 07/22/18 0516  WBC 15.9* 17.8*  HGB 12.5* 10.2*  HCT 38.2* 31.2*  PLT 238 274   BMET Recent Labs    07/21/18 1414 07/22/18 0516  NA  --  136  K  --  4.1  CL  --  103  CO2  --  24  GLUCOSE  --  160*  BUN  --  9  CREATININE 1.07 0.77  CALCIUM  --  8.4*   PT/INR No results for input(s): LABPROT, INR in the last 72 hours. Comprehensive Metabolic Panel:    Component Value Date/Time   NA 136 07/22/2018 0516   NA 139 03/27/2018 0430   K 4.1 07/22/2018 0516   K 3.4 (L) 03/27/2018 0430   CL 103 07/22/2018 0516   CL 106 03/27/2018 0430   CO2 24 07/22/2018 0516   CO2 25 03/27/2018 0430   BUN 9 07/22/2018 0516   BUN 14 03/27/2018 0430   CREATININE 0.77 07/22/2018 0516   CREATININE  1.07 07/21/2018 1414   GLUCOSE 160 (H) 07/22/2018 0516   GLUCOSE 100 (H) 03/27/2018 0430   CALCIUM 8.4 (L) 07/22/2018 0516   CALCIUM 8.5 (L) 03/27/2018 0430   AST 19 03/24/2018 0425   AST 42 (H) 03/23/2018 0618   ALT 26 03/24/2018 0425   ALT 48 (H) 03/23/2018 0618   ALKPHOS 57 03/24/2018 0425   ALKPHOS 93 03/23/2018 0618   BILITOT 0.9 03/24/2018 0425   BILITOT 1.2 03/23/2018 0618   PROT 5.7 (L) 03/24/2018 0425   PROT 8.0 03/23/2018 0618   ALBUMIN 2.6 (L) 03/24/2018 0425   ALBUMIN 3.9 03/23/2018 0618    Studies/Results: No results found.    Leydi Winstead M 07/23/2018  Patient ID: Glenn Duncan, male   DOB: [birth year], 67 y.o.   MRN: [id number]

## 2018-07-23 NOTE — Care Management (Signed)
Per [facility] rep pt is active with them for home health services. Will need MD orders if home health is needed at [place]. Sandford Crazeora Margueritte Guthridge RN,BSN [phone]

## 2018-07-24 MED ORDER — HYDROCODONE-ACETAMINOPHEN 5-325 MG PO TABS: 1.0000 | ORAL_TABLET | ORAL | 0 refills | Status: DC | PRN

## 2018-07-24 NOTE — Discharge Summary (Signed)
Physician Discharge Summary [facility]  Patient ID: Glenn Duncan MRN: [id number] DOB/AGE: [birth year] 66 y.o.  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  Post op course was uncomplicated.  Pain was well controlled.  Tolerated diet as advanced.  Patient was prepared for discharge home on POD#3.  Consults: None  Treatments: surgery: colostomy closure  Discharge Exam: Blood pressure 133/75, pulse 83, temperature 98.6 F (37 C), temperature source Oral, resp. rate 18, height 5\' 8"  (1.727 m), weight 74.7 kg, SpO2 99 %. HEENT - clear Neck - soft Chest - clear bilaterally Cor - RRR Abd - soft without distension; wounds dry and intact with staples; BS present  Disposition: Home  Discharge Instructions    Diet - low sodium heart healthy   Complete by:  As directed    Discharge instructions   Complete by:  As directed    [facility]  OPEN ABDOMINAL SURGERY: POST OP INSTRUCTIONS  Always review your discharge instruction sheet given to you by the facility where your surgery was performed.  A prescription for pain medication may be given to you upon discharge.  Take your pain medication as prescribed.  If narcotic pain medicine is not needed, then you may take acetaminophen (Tylenol) or ibuprofen (Advil) as needed. Take your usually prescribed medications unless otherwise directed. If you need a refill on your pain medication, please contact your pharmacy. They will contact our office to request authorization.  Prescriptions will not be filled after 5 pm  or on weekends. You should follow a light diet the first few days after arrival home, such as soup and crackers, unless your doctor has advised otherwise. A high-fiber, low fat diet can be resumed as tolerated.  Be sure to include plenty of fluids daily.  Most patients will experience some swelling and bruising in the area of the incision. Ice packs will help. Swelling and bruising can take several days to resolve. It is common to experience some constipation if taking pain medication after surgery.  Increasing fluid intake and taking a stool softener will usually help or prevent this problem from occurring.  A mild laxative (Milk of Magnesia or Miralax) should be taken according to package directions if there are no bowel movements after 48 hours.  You may have steri-strips (small skin tapes) in place directly over the incision.  These strips should be left on the skin for 5-7 days.  Any sutures or staples will be removed at the office during your follow-up visit. You may find that a light gauze bandage over your incision may keep your staples from being rubbed or pulled. You may shower and replace the bandage daily. ACTIVITIES:  You may resume regular (light) daily activities beginning the next day - such as daily self-care, walking, climbing stairs - gradually increasing activities as tolerated.  You may have sexual intercourse when it is comfortable.  Refrain from any heavy lifting or straining until approved by your doctor.  You may drive when you no longer are taking prescription pain medication, you can comfortably wear a seatbelt, and you can safely maneuver your car and apply brakes. You should see  your doctor in the office for a follow-up appointment approximately 2-3 weeks after your surgery.  Make sure that you call for this appointment within a day or two after you arrive home to insure a convenient appointment time.  WHEN TO CALL YOUR DOCTOR: Fever greater than 101.0 Inability to  urinate Persistent nausea and/or vomiting Extreme swelling or bruising Continued bleeding from incision Increased pain, redness, or drainage from the incision Difficulty swallowing or breathing Muscle cramping or spasms Numbness or tingling in hands or around lips  IF YOU HAVE DISABILITY OR FAMILY LEAVE FORMS, YOU MUST BRING THEM TO THE OFFICE FOR PROCESSING.  PLEASE DO NOT GIVE THEM TO YOUR DOCTOR.  The clinic staff is available to answer your questions during regular business hours.  Please don't hesitate to call and ask to speak to one of the nurses if you have concerns.  [facility] Office: [phone]  For further questions, please visit [email or web address]   Increase activity slowly   Complete by:  As directed    No dressing needed   Complete by:  As directed      Allergies as of 07/24/2018   No Known Allergies     Medication List    TAKE these medications   acetaminophen 650 MG CR tablet Commonly known as:  TYLENOL Take 1,300 mg by mouth every 8 (eight) hours as needed for pain.   Cocoa Butter Crea Apply 1 application topically 2 (two) times daily.   HM MULTIVITAMIN ADULT GUMMY PO Take 2 each by mouth daily.   HYDROcodone-acetaminophen 5-325 MG tablet Commonly known as:  NORCO/VICODIN Take 1 tablet by mouth every 6 (six) hours as needed for severe pain. What changed:  Another medication with the same name was added. Make sure you understand how and when to take each.   HYDROcodone-acetaminophen 5-325 MG tablet Commonly known as:  NORCO/VICODIN Take 1-2 tablets by mouth every 4 (four) hours as needed for moderate pain. What changed:  You were already taking a medication with the same name, and this prescription was added. Make sure you understand how and when to take each.   methocarbamol 500 MG tablet Commonly known as:  ROBAXIN Take 1 tablet (500 mg total) by mouth every 8 (eight) hours as needed for muscle spasms.       Follow-up Information    Darnell LevelGerkin, Merinda Victorino, MD. Schedule an appointment as soon as possible for a visit in 1 week(s).   Specialty:  General Surgery Contact information: [address] [place] [address] [phone]           Velora Hecklerodd M. Kharis Lapenna, MD, [facility] Office: [phone]   Signed: Velora HecklerGERKIN,Romeka Scifres M 07/24/2018, 9:29 AM

## 2018-07-24 NOTE — Care Management Important Message (Signed)
Important Message  Patient Details  Name: Glenn Duncan MRN: [id number] Date of Birth: November 11, 1950   Medicare Important Message Given:  Yes    Caren MacadamFuller, Rasheed Welty 07/24/2018, 11:27 AMImportant Message  Patient Details  Name: Glenn Beerslbert Dicaprio MRN: [id number] Date of Birth: November 11, 1950   Medicare Important Message Given:  Yes    Caren MacadamFuller, Veona Bittman 07/24/2018, 11:27 AM

## 2018-07-24 NOTE — Progress Notes (Signed)
Discharge and medication instructions reviewed with patient and spouse. Questions answered and both deny further questions. One prescription given to patient. Spouse is here to drive patient home. Lina SarBeth Giulia Hickey, RN

## 2020-01-23 IMAGING — CT CT ABD-PELV W/ CM
4 of 12 series · 12 of 46 positions shown, 16 images · IV contrast (ISOVUE 370)
Comparison: None.

CLINICAL DATA: Abdomen pain for 3 days. Assess for diverticulitis.
Positive D-dimer test assess for pulmonary embolus.

EXAM:
CT ANGIOGRAPHY CHEST
CT ABDOMEN AND PELVIS WITH CONTRAST
TECHNIQUE: Multidetector CT imaging of the chest was performed using the
standard protocol during bolus administration of intravenous
contrast. Multiplanar CT image reconstructions and MIPs were
obtained to evaluate the vascular anatomy. Multidetector CT imaging
of the abdomen and pelvis was performed using the standard protocol
during bolus administration of intravenous contrast.
CONTRAST:  100mL 9ORAXA-AQ1 IOPAMIDOL (9ORAXA-AQ1) INJECTION 76%

[Series 4: axial st · axial · 0.83mm/px · z∈[+1330,+1500]mm · 2 of 103 slices shown, 5 images]
[im 35/103  soft-tissue]
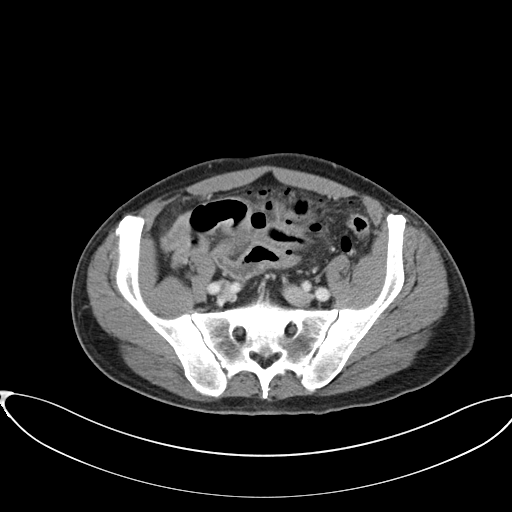
[im 35/103  lung]
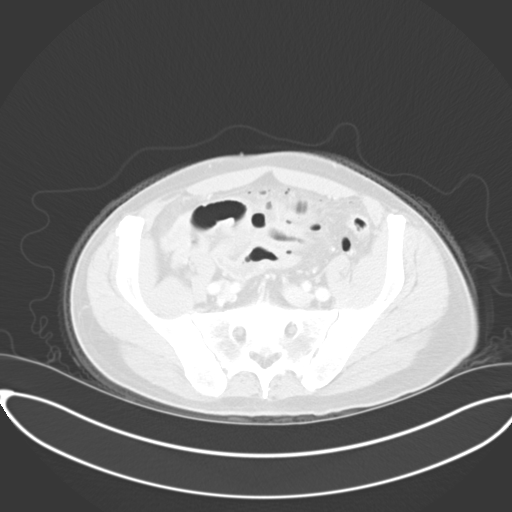
[im 35/103  bone]
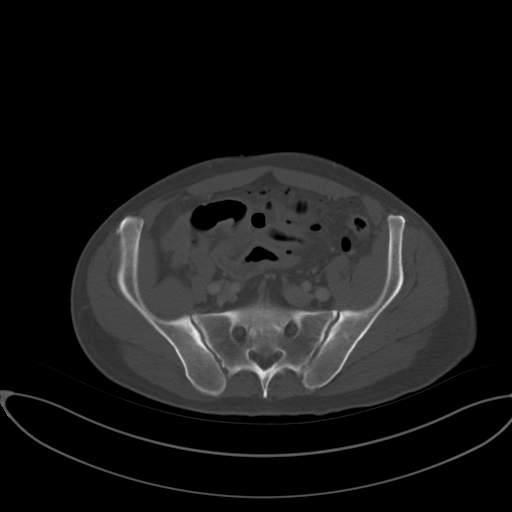
[im 69/103  soft-tissue]
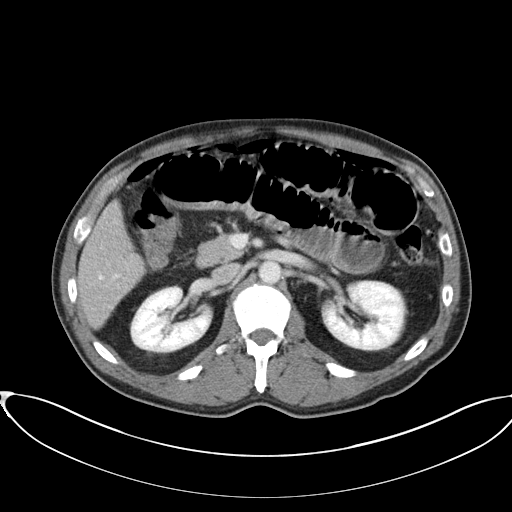
[im 69/103  lung]
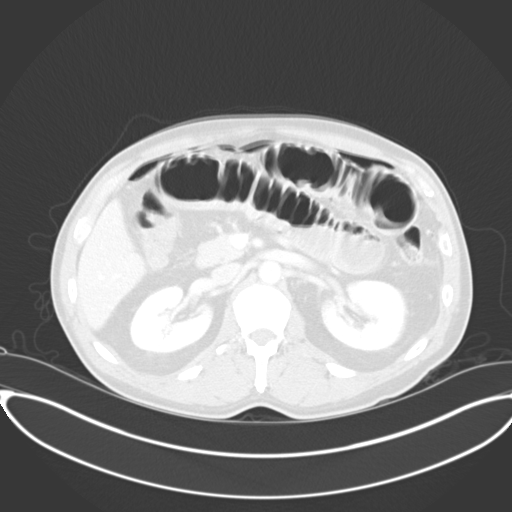

[Series 7: thins · axial · 0.79mm/px · z∈[+1596,+1792]mm · 8 of 255 slices shown]
[im 29/255  soft-tissue]
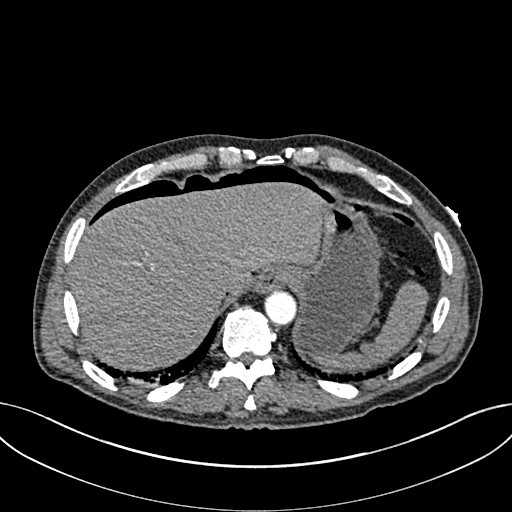
[im 57/255  soft-tissue]
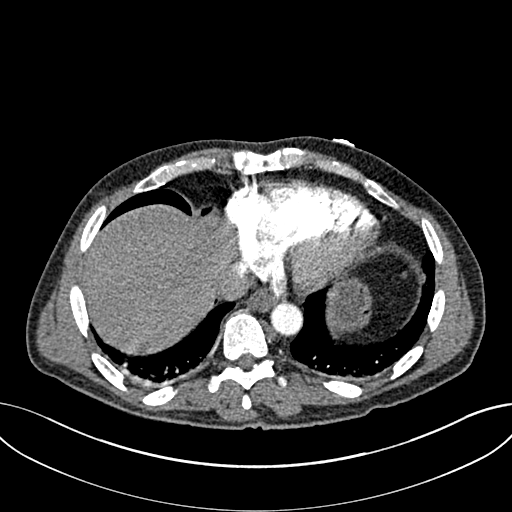
[im 85/255  soft-tissue]
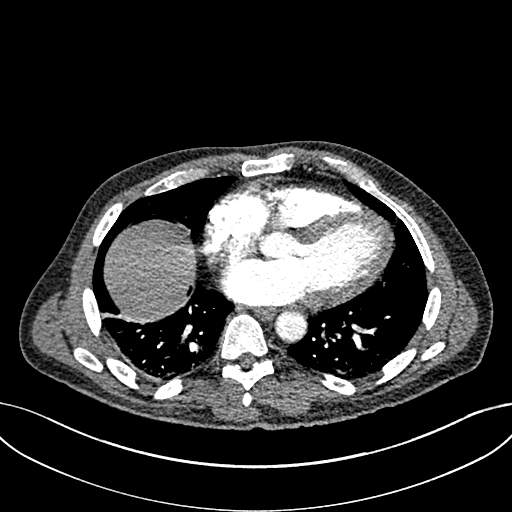
[im 113/255  soft-tissue]
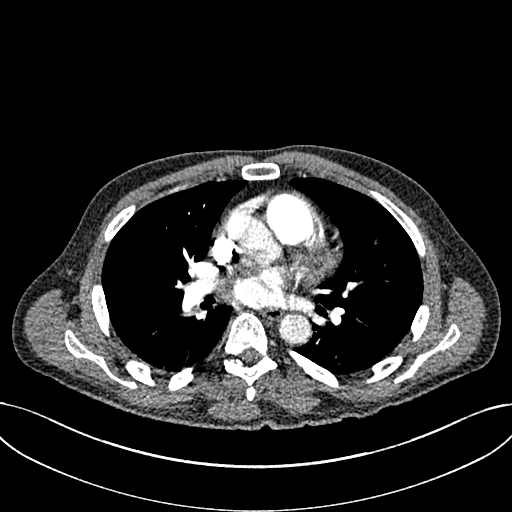
[im 142/255  soft-tissue]
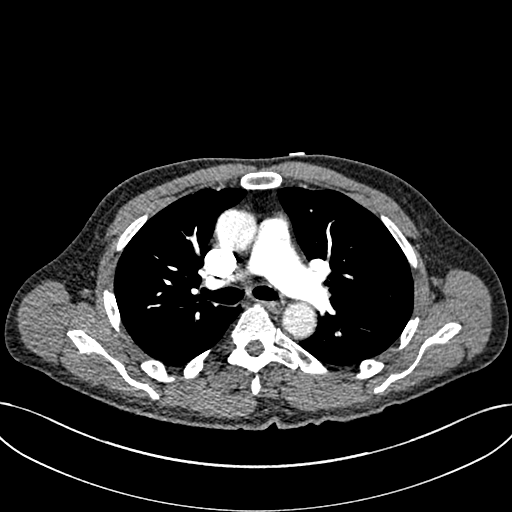
[im 170/255  soft-tissue]
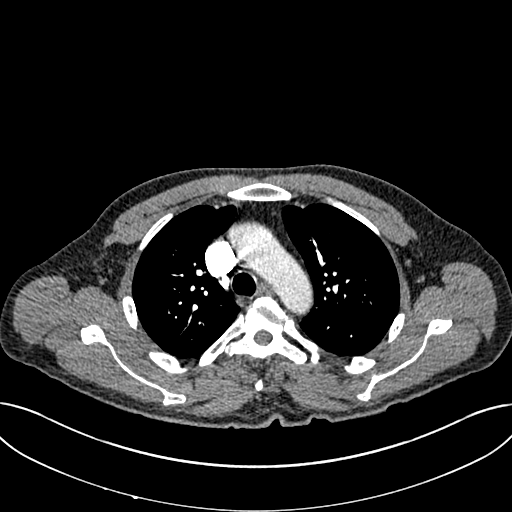
[im 198/255  soft-tissue]
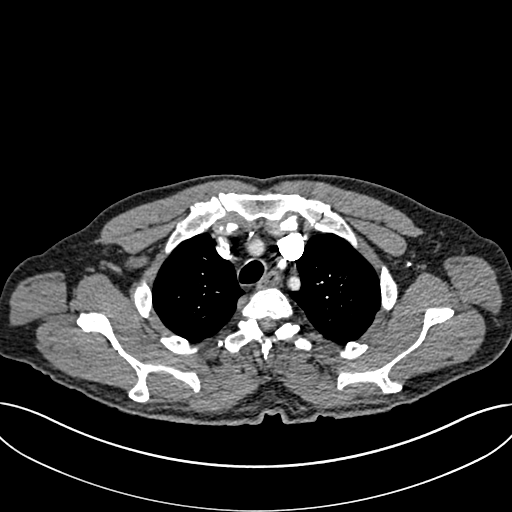
[im 226/255  soft-tissue]
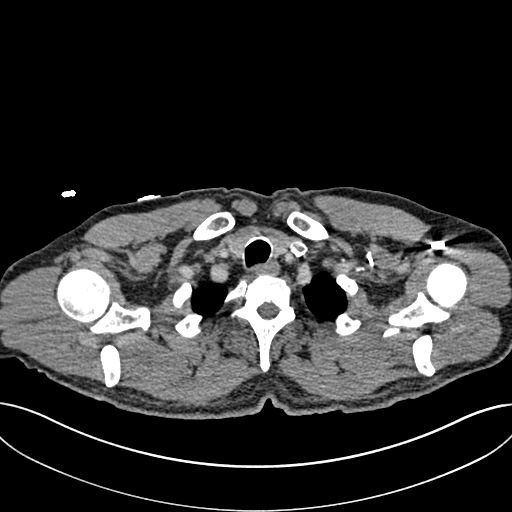

[Series 8: coronal mpr · coronal · 0.52mm/px · 1 of 111 slices shown, 2 images]
[im 56/111  soft-tissue]
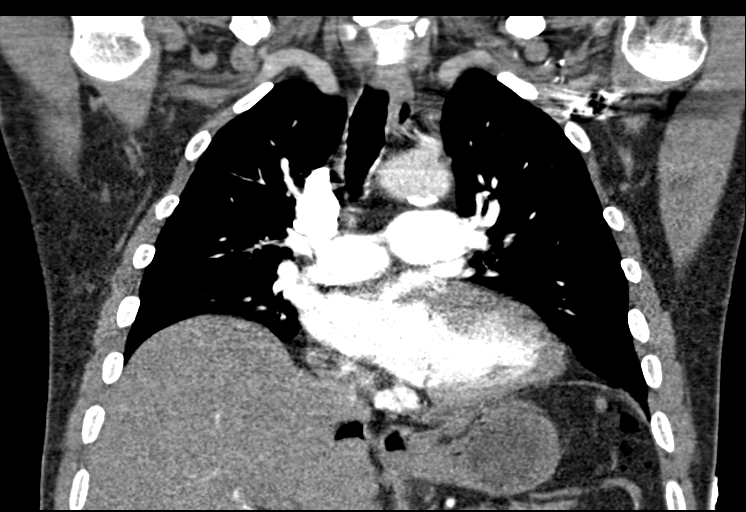
[im 56/111  bone]
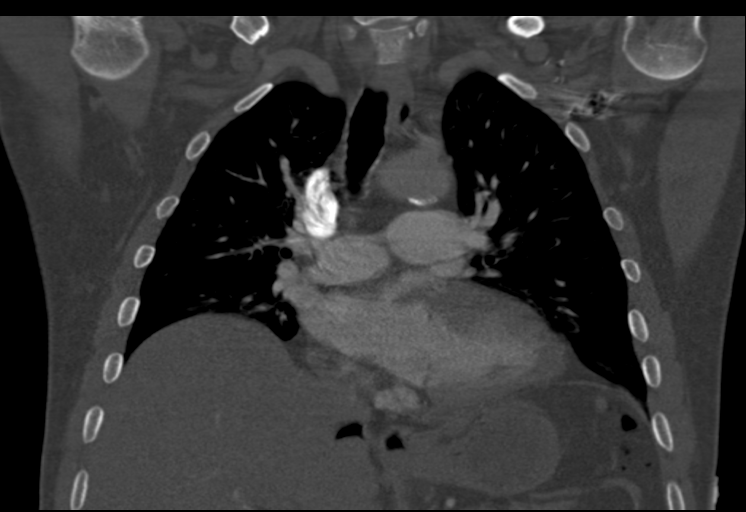

[Series 15: lung bases · axial · 0.80mm/px · 1 of 216 slices shown]
[im 27/216  bone]
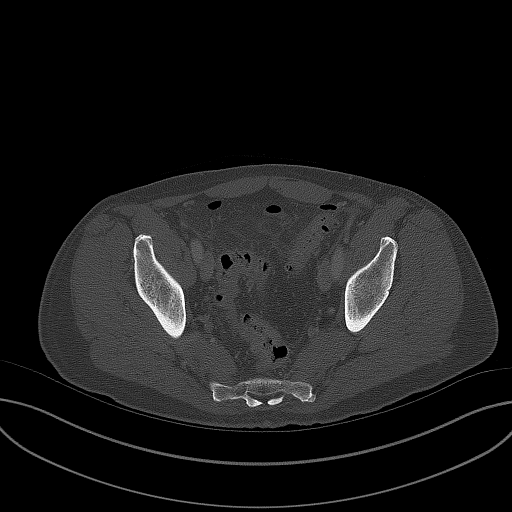

[12 of 46 positions shown; findings below may reference images not displayed]

FINDINGS: CTA CHEST FINDINGS

Cardiovascular: Satisfactory opacification of the pulmonary arteries
to the segmental level. No evidence of pulmonary embolism. Normal
heart size. No pericardial effusion.

Mediastinum/Nodes: No enlarged mediastinal, hilar, or axillary lymph
nodes. Thyroid gland, trachea, and esophagus demonstrate no
significant findings.

Lungs/Pleura: There is atelectasis of the lung bases. No focal
pneumonia or pleural effusion is identified.

Musculoskeletal: There is scoliosis of spine. No focal acute
abnormalities noted.

Review of the MIP images confirms the above findings.

CT ABDOMEN and PELVIS FINDINGS

Hepatobiliary: No focal liver abnormality is seen. No gallstones,
gallbladder wall thickening, or biliary dilatation.

Pancreas: Unremarkable. No pancreatic ductal dilatation or
surrounding inflammatory changes.

Spleen: Normal in size without focal abnormality.

Adrenals/Urinary Tract: The adrenal glands are normal. Tiny cyst is
identified in the upper pole right kidney. No left renal lesion is
identified. There is no hydronephrosis or nephrolithiasis
bilaterally. The bladder is normal.

Stomach/Bowel: Extensive free air is identified in the abdomen and
pelvis more prominently in the abdomen. This consistent with
perforated bowel loop. There is stranding and inflammation
surrounding thickened small bowel loops in the lower mid abdomen
with marked dilatation of small bowel loops proximal to this area of
inflammation. The ileal small bowel loops are normal in caliber but
demonstrate abnormal diffuse thickened bowel wall. There is
diverticulosis of the colon without definite evidence of
diverticulitis. The appendix is normal.

Vascular/Lymphatic: Aortic atherosclerosis. No enlarged abdominal or
pelvic lymph nodes.

Reproductive: Prostate gland is enlarged measuring 6.2 cm in
diameter.

Other: Minimal umbilical herniation of mesenteric fat is identified.
Small amount of free fluid is identified in the pelvis.

Musculoskeletal: Degenerative joint changes of the spine are noted.

Review of the MIP images confirms the above findings.
IMPRESSION: No pulmonary embolus.  Atelectasis of lung bases.

Extensive free air identified in the abdomen pelvis consistent with
perforated bowel loop. There is stranding and inflammation
surrounding thickened small bowel loops in the lower abdomen with
marked dilatation of small bowel loops proximal to this area of
inflammation suggesting this may the center of the inflammation and
possible site of perforation.

Critical Value/emergent results were called by telephone at the time
of interpretation on 03/23/2018 at [DATE] to Dr. PROFILNORD HIRCIN ,
who verbally acknowledged these results.

## 2023-02-03 ENCOUNTER — Ambulatory Visit: Payer: Medicare HMO | Admitting: Podiatry

## 2023-02-03 ENCOUNTER — Encounter: Payer: Self-pay | Admitting: Podiatry

## 2023-02-03 ENCOUNTER — Ambulatory Visit (INDEPENDENT_AMBULATORY_CARE_PROVIDER_SITE_OTHER): Payer: Medicare HMO

## 2023-02-03 DIAGNOSIS — M778 Other enthesopathies, not elsewhere classified: Secondary | ICD-10-CM | POA: Diagnosis not present

## 2023-02-03 MED ORDER — MELOXICAM 15 MG PO TABS: 15.0000 mg | ORAL_TABLET | Freq: Every day | ORAL | 0 refills | Status: DC | PRN

## 2023-02-03 NOTE — Patient Instructions (Signed)
For inserts I like POWER STEPS, SUPERFEET, AETREX.  You can also add the METATARSAL PAD to the insert.

## 2023-02-05 NOTE — Progress Notes (Signed)
Subjective:   Patient ID: Glenn Duncan, male   DOB: 72 y.o.   MRN: [id number]   HPI Chief Complaint  Patient presents with   Foot Pain    Plantar forefoot (sub 2nd) right - aching, swelling x couple weeks, feels like a sock is bunched under the toes, mows a lot and also plays in a band, tried Tylenol   New Patient (Initial Visit)   72 year old male presents the office for above concerns.  He states been having discomfort in the right foot submetatarsal 2 area and swelling for last couple weeks.  Feels that his sock is bunched up under the toes.  He is very active and mows his yard and plays in the band on his feet a lot.  No injuries that he reports.  No numbness or tingling into the toes.   Review of Systems  All other systems reviewed and are negative.  Past Medical History:  Diagnosis Date   Arthritis    back   Diverticular disease     Past Surgical History:  Procedure Laterality Date   COLOSTOMY TAKEDOWN N/A 07/21/2018   Procedure: OPEN COLOSTOMY CLOSURE, RIGID PROCTOSCOPY;  Surgeon: Darnell Level, MD;  Location: [facility];  Service: General;  Laterality: N/A;   LAPAROTOMY N/A 03/23/2018   Procedure: EXPLORATORY LAPAROTOMY, SIGMOIDECTOMY, DIVERTING COLOSTOMY (HARTMANNS PROCEDURE);  Surgeon: Darnell Level, MD;  Location: [facility];  Service: General;  Laterality: N/A;     Current Outpatient Medications:    meloxicam (MOBIC) 15 MG tablet, Take 1 tablet (15 mg total) by mouth daily as needed for pain., Disp: 30 tablet, Rfl: 0   acetaminophen (TYLENOL) 650 MG CR tablet, Take 1,300 mg by mouth every 8 (eight) hours as needed for pain., Disp: , Rfl:    Multiple Vitamins-Minerals (HM MULTIVITAMIN ADULT GUMMY PO), Take 2 each by mouth daily., Disp: , Rfl:   No Known Allergies        Objective:  Physical Exam  General: AAO x3, NAD  Dermatological: Skin is warm, dry and supple bilateral.  There are no open sores, no preulcerative lesions, no rash or signs of infection  present.  Vascular: Dorsalis Pedis artery and Posterior Tibial artery pedal pulses are 2/4 bilateral with immedate capillary fill time. There is no pain with calf compression, swelling, warmth, erythema.   Neruologic: Grossly intact via light touch bilateral.  Musculoskeletal: There is minimal edema present submetatarsal 2 plantarly of the right foot.  He does get discomfort on the second interspace and small palpable bursa noted versus possible neuroma but he has not experienced that nerve symptoms.  There is no area pinpoint tenderness.  Muscular strength 5/5 in all groups tested bilateral.  Gait: Unassisted, Nonantalgic.       Assessment:   72 year old male with capsulitis right foot     Plan:  -Treatment options discussed including all alternatives, risks, and complications -Etiology of symptoms were discussed -X-rays were obtained and reviewed with the patient.  3 views of the foot are obtained.  No evidence of acute fracture.  Bunion present. -We discussed steroid injection but ultimately held off on this today.  We discussed adding metatarsal pads were dispensed.  Discussed shoes, good arch support and inserts inside the shoes of the metatarsal support.  Consider custom molded orthotics with metatarsal support if needed. -Prescribed mobic. Discussed side effects of the medication and directed to stop if any are to occur and call the office.   Vivi Barrack DPM

## 2023-03-02 ENCOUNTER — Other Ambulatory Visit: Payer: Self-pay | Admitting: Podiatry

## 2023-04-03 ENCOUNTER — Other Ambulatory Visit: Payer: Self-pay | Admitting: Podiatry

## 2023-05-11 ENCOUNTER — Other Ambulatory Visit: Payer: Self-pay | Admitting: Podiatry

## 2023-06-17 ENCOUNTER — Other Ambulatory Visit: Payer: Self-pay | Admitting: Podiatry

## 2023-10-13 ENCOUNTER — Other Ambulatory Visit: Payer: Self-pay

## 2023-10-13 ENCOUNTER — Emergency Department (HOSPITAL_BASED_OUTPATIENT_CLINIC_OR_DEPARTMENT_OTHER)
Admission: EM | Admit: 2023-10-13 | Discharge: 2023-10-13 | Disposition: A | Discharge status: Home / Self Care | Attending: Emergency Medicine | Admitting: Emergency Medicine

## 2023-10-13 ENCOUNTER — Emergency Department (HOSPITAL_BASED_OUTPATIENT_CLINIC_OR_DEPARTMENT_OTHER)

## 2023-10-13 ENCOUNTER — Encounter (HOSPITAL_BASED_OUTPATIENT_CLINIC_OR_DEPARTMENT_OTHER): Payer: Self-pay

## 2023-10-13 DIAGNOSIS — I6529 Occlusion and stenosis of unspecified carotid artery: Secondary | ICD-10-CM | POA: Diagnosis not present

## 2023-10-13 DIAGNOSIS — G510 Bell's palsy: Secondary | ICD-10-CM | POA: Insufficient documentation

## 2023-10-13 DIAGNOSIS — I6789 Other cerebrovascular disease: Secondary | ICD-10-CM | POA: Insufficient documentation

## 2023-10-13 DIAGNOSIS — R29810 Facial weakness: Secondary | ICD-10-CM | POA: Diagnosis present

## 2023-10-13 LAB — BASIC METABOLIC PANEL
Anion gap: 8 (ref 5–15)
BUN: 10 mg/dL (ref 8–23)
CO2: 29 mmol/L (ref 22–32)
Calcium: 10.3 mg/dL (ref 8.9–10.3)
Chloride: 102 mmol/L (ref 98–111)
Creatinine, Ser: 1.19 mg/dL (ref 0.61–1.24)
GFR, Estimated: >60 mL/min (ref >60)
Glucose, Bld: 92 mg/dL (ref 70–99)
Potassium: 4.1 mmol/L (ref 3.5–5.1)
Sodium: 139 mmol/L (ref 135–145)

## 2023-10-13 LAB — PROTIME-INR
INR: 0.9 (ref 0.8–1.2)
Prothrombin Time: 12.6 s (ref 11.4–15.2)

## 2023-10-13 LAB — CBC
HCT: 47.9 % (ref 39.0–52.0)
Hemoglobin: 15.6 g/dL (ref 13.0–17.0)
MCH: 28 pg (ref 26.0–34.0)
MCHC: 32.6 g/dL (ref 30.0–36.0)
MCV: 86 fL (ref 80.0–100.0)
Platelets: 290 10*3/uL (ref 150–400)
RBC: 5.57 MIL/uL (ref 4.22–5.81)
RDW: 14.1 % (ref 11.5–15.5)
WBC: 10.5 10*3/uL (ref 4.0–10.5)
nRBC: 0 % (ref 0.0–0.2)

## 2023-10-13 MED ORDER — VALACYCLOVIR HCL 1 G PO TABS: 1000.0000 mg | ORAL_TABLET | Freq: Three times a day (TID) | ORAL | 0 refills | Status: DC

## 2023-10-13 MED ORDER — PREDNISONE 20 MG PO TABS: 60.0000 mg | ORAL_TABLET | Freq: Every day | ORAL | 0 refills | Status: AC

## 2023-10-13 MED ORDER — VALACYCLOVIR HCL 1 G PO TABS
1000.0000 mg | ORAL_TABLET | Freq: Three times a day (TID) | ORAL | 0 refills | Status: DC
Start: 2023-10-13 — End: 2023-10-13

## 2023-10-13 MED ORDER — VALACYCLOVIR HCL 1 G PO TABS
1000.0000 mg | ORAL_TABLET | Freq: Three times a day (TID) | ORAL | 0 refills | Status: AC
Start: 2023-10-13 — End: 2023-10-20

## 2023-10-13 MED ORDER — PREDNISONE 50 MG PO TABS
60.0000 mg | ORAL_TABLET | Freq: Once | ORAL | Status: AC
  Administered 2023-10-13: 60 mg via ORAL
  Filled 2023-10-13: qty 1

## 2023-10-13 MED ORDER — VALACYCLOVIR HCL 500 MG PO TABS
1000.0000 mg | ORAL_TABLET | ORAL | Status: AC
  Administered 2023-10-13: 1000 mg via ORAL
  Filled 2023-10-13: qty 2

## 2023-10-13 NOTE — Discharge Instructions (Addendum)
 It was a pleasure taking part in your care.  As discussed, you are suffering from Bell's palsy.  Please read the attached guide concerning Bell's palsy.  Please begin taking steroids once a day for the next 7 days 60 mg.  Please also begin taking valacyclovir otherwise known as Valtrex 1000 mg 3 times a day for 1 week.  Please purchase artificial tears from your local pharmacy and apply these to your eye to avoid any kind of dry eye.  Please also purchase an eye patch and wear this over your eye during the day as well as night.

## 2023-10-13 NOTE — ED Triage Notes (Signed)
 Pt c/o stroke-like symptoms- advises he found "itchy spot- like a little pimple" behind L ear, states "eye was dripping last night." Woke up this AM & tried to gargle, "it was coming out one side." No unilateral weakness, droop noted to L side of face but states he was able to play guitar, eat & drink, go out & run errands. Concern for bells palsy

## 2023-10-13 NOTE — ED Provider Triage Note (Signed)
 Emergency Medicine Provider Triage Evaluation Note  Jauan Wohl , a 73 y.o. male  was evaluated in triage.  Pt complains of left sided facial droop. States it began this morning at 8am. Reports just facial droop. No slurred speech, numbness or weakness. Denies headache, neck pain, visual disturbance.  Review of Systems  Positive:  Negative:   Physical Exam  BP (!) 167/94 (BP Location: Right Arm)   Pulse 90   Temp 98.4 F (36.9 C)   Resp 16   SpO2 100%  Gen:   Awake, no distress   Resp:  Normal effort  MSK:   Moves extremities without difficulty  Other:  Equal grip strength upper extremities bilaterally, lower extremities bilaterally. Sensation intact. CN III, IV, V, VI, VIII, IX, X, XI, XII in tact.  Medical Decision Making  Medically screening exam initiated at 7:40 PM.  Appropriate orders placed.  Kanan Sobek was informed that the remainder of the evaluation will be completed by another provider, this initial triage assessment does not replace that evaluation, and the importance of remaining in the ED until their evaluation is complete.   Al Decant, PA-C 10/13/23 1942

## 2023-10-13 NOTE — ED Notes (Signed)
 Pt started having a funny feeling from behind right ear last night, noticed he was not able to gargle and was drooling.   No neuro deficits noted.

## 2023-10-13 NOTE — ED Provider Notes (Signed)
 [place] [facility] AT [facility] Provider Note   CSN: [id number] Arrival date & time: 10/13/23  1512     History  Chief Complaint  Patient presents with   Facial Droop    Glenn Duncan is a 73 y.o. male who presents to the ED for evaluation of left-sided facial droop.  States it began this morning at 8am.  Patient states he is only having left-sided facial droop.. No slurred speech, numbness or weakness. Denies headache, neck pain, visual disturbance.  Denies history of CVA, word finding difficulty, gait imbalance.  Reports that he notices symptoms progressively worsening throughout the course of the day.  Reports that he was able to play his guitar after left-sided facial droop occurred.  Denies any other concerns.    HPI     Home Medications Prior to Admission medications   Medication Sig Start Date End Date Taking? Authorizing Provider  predniSONE (DELTASONE) 20 MG tablet Take 3 tablets (60 mg total) by mouth daily. 10/13/23  Yes Al Decant, PA-C  acetaminophen (TYLENOL) 650 MG CR tablet Take 1,300 mg by mouth every 8 (eight) hours as needed for pain.    [provider]  meloxicam (MOBIC) 15 MG tablet TAKE 1 TABLET BY MOUTH EVERY DAY AS NEEDED FOR PAIN 06/17/23   Standiford, Jenelle Mages, DPM  Multiple Vitamins-Minerals (HM MULTIVITAMIN ADULT GUMMY PO) Take 2 each by mouth daily.    [provider]  valACYclovir (VALTREX) 1000 MG tablet Take 1 tablet (1,000 mg total) by mouth 3 (three) times daily for 7 days. 10/13/23 10/20/23  Al Decant, PA-C      Allergies    Patient has no known allergies.    Review of Systems   Review of Systems  Eyes:  Negative for visual disturbance.  Musculoskeletal:  Negative for neck pain.  Neurological:  Positive for facial asymmetry. Negative for weakness, numbness and headaches.  All other systems reviewed and are negative.   Physical Exam Updated Vital Signs BP (!) 157/68   Pulse (!)  59   Temp (!) 96.9 F (36.1 C)   Resp 18   SpO2 100%  Physical Exam Vitals and nursing note reviewed.  Constitutional:      General: He is not in acute distress.    Appearance: He is well-developed.  HENT:     Head: Normocephalic and atraumatic.  Eyes:     Conjunctiva/sclera: Conjunctivae normal.  Cardiovascular:     Rate and Rhythm: Normal rate and regular rhythm.     Heart sounds: No murmur heard. Pulmonary:     Effort: Pulmonary effort is normal. No respiratory distress.     Breath sounds: Normal breath sounds.  Abdominal:     Palpations: Abdomen is soft.     Tenderness: There is no abdominal tenderness.  Musculoskeletal:        General: No swelling.     Cervical back: Neck supple.  Skin:    General: Skin is warm and dry.     Capillary Refill: Capillary refill takes less than 2 seconds.  Neurological:     Mental Status: He is alert.     GCS: GCS eye subscore is 4. GCS verbal subscore is 5. GCS motor subscore is 6.     Sensory: Sensation is intact. No sensory deficit.     Motor: Motor function is intact. No weakness.     Comments: Patient with left-sided facial droop.  Does not spare forehead.  Unable to lift left eyebrow.  CN III, CN IV, CN V, CN VI, CN VIII, IX, X, XI and XII intact.  Equal grip strength upper extremities bilaterally.  Equal strength bilateral lower extremities.  No pronator drift, slurred speech.  Psychiatric:        Mood and Affect: Mood normal.     ED Results / Procedures / Treatments   Labs (all labs ordered are listed, but only abnormal results are displayed) Labs Reviewed  CBC  BASIC METABOLIC PANEL  PROTIME-INR    EKG None  Radiology CT Head Wo Contrast Result Date: 10/13/2023 CLINICAL DATA:  Neuro deficit, acute, with stroke-like symptoms. EXAM: CT HEAD WITHOUT CONTRAST TECHNIQUE: Contiguous axial images were obtained from the base of the skull through the vertex without intravenous contrast. RADIATION DOSE REDUCTION: This exam was  performed according to the departmental dose-optimization program which includes automated exposure control, adjustment of the mA and/or kV according to patient size and/or use of iterative reconstruction technique. COMPARISON:  None Available. FINDINGS: Brain: No evidence of acute infarction, hemorrhage, hydrocephalus, extra-axial collection or mass lesion/mass effect. There is mild cerebral atrophy and small-vessel disease. Vascular: No hyperdense vessel or unexpected calcification. There are scattered calcific plaques are both siphons. Skull: Negative for fractures or focal lesions. Sinuses/Orbits: Negative orbits. Clear sinuses and mastoids. Mild right deviation of the anterior nasal septum. Other: None. IMPRESSION: 1. No acute intracranial CT findings. 2. Mild atrophy and small-vessel disease. 3. Beginning intracranial carotid atherosclerosis. Electronically Signed   By: Almira Bar M.D.   On: 10/13/2023 21:41    Procedures Procedures   Medications Ordered in ED Medications  predniSONE (DELTASONE) tablet 60 mg (60 mg Oral Given 10/13/23 2100)  valACYclovir (VALTREX) tablet 1,000 mg (1,000 mg Oral Given 10/13/23 2100)    ED Course/ Medical Decision Making/ A&P   Medical Decision Making Amount and/or Complexity of Data Reviewed Labs: ordered. Radiology: ordered.  Risk Prescription drug management.   73 year old male presents for evaluation.  Please see HPI for further details.  On exam patient is afebrile and nontachycardic.  Lung sounds are clear bilaterally, nonhypoxic.  Abdomen is soft and compressible throughout.  Neurological examination reveals a left-sided facial droop.  This does not spare the forehead.  He is unable to lift his left eyebrow.  The rest of his cranial nerve examination is intact.  He has equal grip strength in upper extremities bilaterally.  Equal strength to lower extremities bilaterally.  No pronator drift or slurred speech.  Concern for Bell's palsy.  Will  collect labs and CT scan patient had abundance of caution.  Will provide with valacyclovir, steroid.  CBC without abnormality.  PT/INR WNL.  Metabolic panel without Electra derangement.  CT head without contrast shows no intracranial abnormality.  At this time, patient most like suffering from Bell's palsy.  Will send patient home with steroids, valacyclovir.  Will have him follow-up with his PCP and ophthalmology team.  Have advised him to purchase artificial tears as well as a eye patch to cover his left eye at night.  Return precautions provided and he voiced understanding.  He is stable to discharge home.  Final Clinical Impression(s) / ED Diagnoses Final diagnoses:  Bell's palsy    Rx / DC Orders ED Discharge Orders          Ordered    predniSONE (DELTASONE) 20 MG tablet  Daily        10/13/23 2137    valACYclovir (VALTREX) 1000 MG tablet  3 times daily,   Status:  Discontinued        10/13/23 2137    valACYclovir (VALTREX) 1000 MG tablet  3 times daily,   Status:  Discontinued        10/13/23 2137    valACYclovir (VALTREX) 1000 MG tablet  3 times daily,   Status:  Discontinued        10/13/23 2138    valACYclovir (VALTREX) 1000 MG tablet  3 times daily,   Status:  Discontinued        10/13/23 2138    valACYclovir (VALTREX) 1000 MG tablet  3 times daily        10/13/23 2139              Al Decant, PA-C 10/23/23 1946    Franne Forts, DO 10/30/23 [phone]
